# Patient Record
Sex: Female | Born: 1940 | Race: White | Hispanic: No | Marital: Single | Smoking: Never smoker
Health system: Southern US, Community
[De-identification: ages and names within clinical notes are randomized; demographics above are authoritative.]

## PROBLEM LIST (undated history)

## (undated) DIAGNOSIS — N35919 Unspecified urethral stricture, male, unspecified site: Secondary | ICD-10-CM

## (undated) DIAGNOSIS — N390 Urinary tract infection, site not specified: Secondary | ICD-10-CM

## (undated) DIAGNOSIS — I10 Essential (primary) hypertension: Secondary | ICD-10-CM

## (undated) DIAGNOSIS — R339 Retention of urine, unspecified: Secondary | ICD-10-CM

## (undated) DIAGNOSIS — C55 Malignant neoplasm of uterus, part unspecified: Secondary | ICD-10-CM

## (undated) DIAGNOSIS — K219 Gastro-esophageal reflux disease without esophagitis: Secondary | ICD-10-CM

## (undated) DIAGNOSIS — T753XXA Motion sickness, initial encounter: Secondary | ICD-10-CM

## (undated) HISTORY — DX: Urinary tract infection, site not specified: N39.0

## (undated) HISTORY — PX: CARPAL TUNNEL RELEASE: SHX101

## (undated) HISTORY — DX: Retention of urine, unspecified: R33.9

## (undated) HISTORY — DX: Unspecified urethral stricture, male, unspecified site: N35.919

## (undated) HISTORY — PX: ABDOMINAL HYSTERECTOMY: SHX81

## (undated) HISTORY — PX: OTHER SURGICAL HISTORY: SHX169

## (undated) HISTORY — PX: BREAST CYST EXCISION: SHX579

---

## 1999-09-05 DIAGNOSIS — C55 Malignant neoplasm of uterus, part unspecified: Secondary | ICD-10-CM

## 1999-09-05 HISTORY — DX: Malignant neoplasm of uterus, part unspecified: C55

## 2004-12-20 ENCOUNTER — Ambulatory Visit: Payer: Self-pay | Admitting: Gastroenterology

## 2005-02-02 ENCOUNTER — Ambulatory Visit: Payer: Self-pay | Admitting: Gastroenterology

## 2005-10-02 ENCOUNTER — Ambulatory Visit: Payer: Self-pay | Admitting: Family Medicine

## 2006-10-04 ENCOUNTER — Ambulatory Visit: Payer: Self-pay | Admitting: Internal Medicine

## 2007-10-07 ENCOUNTER — Ambulatory Visit: Payer: Self-pay | Admitting: Internal Medicine

## 2007-12-09 ENCOUNTER — Other Ambulatory Visit: Payer: Self-pay

## 2007-12-09 ENCOUNTER — Emergency Department: Payer: Self-pay | Admitting: Emergency Medicine

## 2008-12-14 ENCOUNTER — Ambulatory Visit: Payer: Self-pay | Admitting: Internal Medicine

## 2008-12-22 ENCOUNTER — Ambulatory Visit: Payer: Self-pay | Admitting: Internal Medicine

## 2009-11-16 ENCOUNTER — Ambulatory Visit: Payer: Self-pay | Admitting: Internal Medicine

## 2010-01-24 ENCOUNTER — Ambulatory Visit: Payer: Self-pay | Admitting: Internal Medicine

## 2011-07-11 ENCOUNTER — Ambulatory Visit: Payer: Self-pay | Admitting: Internal Medicine

## 2012-08-30 ENCOUNTER — Ambulatory Visit: Payer: Self-pay | Admitting: Internal Medicine

## 2013-10-24 ENCOUNTER — Ambulatory Visit: Payer: Self-pay | Admitting: Internal Medicine

## 2015-05-24 ENCOUNTER — Other Ambulatory Visit: Payer: Self-pay | Admitting: Internal Medicine

## 2015-05-24 DIAGNOSIS — Z1231 Encounter for screening mammogram for malignant neoplasm of breast: Secondary | ICD-10-CM

## 2015-05-31 ENCOUNTER — Other Ambulatory Visit: Payer: Self-pay | Admitting: Internal Medicine

## 2015-05-31 ENCOUNTER — Ambulatory Visit
Admission: RE | Admit: 2015-05-31 | Discharge: 2015-05-31 | Disposition: A | Discharge status: Home / Self Care | Payer: Medicare Other | Source: Ambulatory Visit | Attending: Internal Medicine | Admitting: Internal Medicine

## 2015-05-31 DIAGNOSIS — Z1231 Encounter for screening mammogram for malignant neoplasm of breast: Secondary | ICD-10-CM

## 2015-05-31 HISTORY — DX: Malignant neoplasm of uterus, part unspecified: C55

## 2015-08-16 ENCOUNTER — Ambulatory Visit: Payer: Self-pay | Admitting: Urology

## 2016-04-27 ENCOUNTER — Telehealth: Payer: Self-pay

## 2016-04-27 ENCOUNTER — Ambulatory Visit (INDEPENDENT_AMBULATORY_CARE_PROVIDER_SITE_OTHER): Payer: Medicare Other | Admitting: Urology

## 2016-04-27 ENCOUNTER — Encounter: Payer: Self-pay | Admitting: Urology

## 2016-04-27 VITALS — BP 138/79 | HR 80 | Ht 60.0 in | Wt 152.8 lb

## 2016-04-27 DIAGNOSIS — R3915 Urgency of urination: Secondary | ICD-10-CM | POA: Diagnosis not present

## 2016-04-27 DIAGNOSIS — R339 Retention of urine, unspecified: Secondary | ICD-10-CM | POA: Diagnosis not present

## 2016-04-27 DIAGNOSIS — R300 Dysuria: Secondary | ICD-10-CM | POA: Diagnosis not present

## 2016-04-27 LAB — URINALYSIS, COMPLETE
Bilirubin, UA: NEGATIVE
GLUCOSE, UA: NEGATIVE
Ketones, UA: NEGATIVE
Leukocytes, UA: NEGATIVE
Nitrite, UA: NEGATIVE
PH UA: 7 (ref 5.0–7.5)
PROTEIN UA: NEGATIVE
RBC, UA: NEGATIVE
Specific Gravity, UA: 1.015 (ref 1.005–1.030)
Urobilinogen, Ur: 0.2 mg/dL (ref 0.2–1.0)

## 2016-04-27 LAB — MICROSCOPIC EXAMINATION
BACTERIA UA: NONE SEEN
EPITHELIAL CELLS (NON RENAL): NONE SEEN /HPF (ref 0–10)

## 2016-04-27 LAB — BLADDER SCAN AMB NON-IMAGING: Scan Result: 188

## 2016-04-27 MED ORDER — CIPROFLOXACIN HCL 250 MG PO TABS: 250.0000 mg | ORAL_TABLET | Freq: Two times a day (BID) | ORAL | 0 refills | Status: DC

## 2016-04-27 NOTE — Telephone Encounter (Signed)
Pt called stating she is having urethra spasms and thinks she has a UTI. Nurse made pt aware she has not been seen in the last year therefore she needs an office appt. Pt voiced understanding. Pt was transferred to the front to make f/u appt.

## 2016-04-27 NOTE — Progress Notes (Signed)
04/27/2016 10:04 AM   Renee Peck April 15, 1941 [id number]  Referring provider: No referring provider defined for this encounter.  Chief Complaint  Patient presents with  . Urinary Tract Infection    patient thinks she has an uti    HPI: Patient is a 75 year old Caucasian female who presents today requesting an urgent appointment for urethral spasms.  Patient states that last evening she started having spasms in her urethra.  She had an old prescription of Cipro on hand, so she took one tablet.  Her symptoms did not improve.  She is having frequency, urgency, intermittency and straining to urinate.  She denies gross hematuria or dysuria.  She is not having fevers, chills or vomiting.  She is having nausea.    She is having associated lower back pain and lower abdominal pain.  Her UA today is unremarkable.  Her PVR was 188 mL.    PMH: Past Medical History:  Diagnosis Date  . Recurrent UTI   . Urethral stricture   . Uterine cancer [facility]) 2001    Surgical History: Past Surgical History:  Procedure Laterality Date  . ABDOMINAL HYSTERECTOMY    . bladder polyps    . CARPAL TUNNEL RELEASE      Home Medications:    Medication List       Accurate as of 04/27/16 10:04 AM. Always use your most recent med list.          aspirin EC 81 MG tablet Take by mouth.   calcium citrate-vitamin D 315-200 MG-UNIT tablet Commonly known as:  CITRACAL+D Take by mouth.   ciprofloxacin 250 MG tablet Commonly known as:  CIPRO Take 1 tablet (250 mg total) by mouth 2 (two) times daily.   FISH OIL PO Take by mouth.   MULTI-VITAMINS Tabs Take by mouth.   NEOMYCIN-POLYMYXIN-HYDROCORTISONE 1 % Soln otic solution Commonly known as:  CORTISPORIN Place in ear(s).   omeprazole 20 MG capsule Commonly known as:  PRILOSEC Take by mouth.   triamterene-hydrochlorothiazide 37.5-25 MG capsule Commonly known as:  DYAZIDE Take by mouth.   verapamil 120 MG CR tablet Commonly known  as:  CALAN-SR Take by mouth.   vitamin E 400 UNIT capsule Take by mouth.       Allergies:  Allergies  Allergen Reactions  . Losartan Other (See Comments)    Hypotension.  . Metronidazole Other (See Comments)    Onset 05/24/1999  . Penicillins Other (See Comments)    05/13/2002    Family History: Family History  Problem Relation Age of Onset  . Breast cancer Mother 29  . Breast cancer Sister 68    paternal sister   . Breast cancer Maternal Aunt 60  . Kidney disease Neg Hx   . Bladder Cancer Neg Hx     Social History:  reports that she has never smoked. She has never used smokeless tobacco. She reports that she does not drink alcohol or use drugs.  ROS: UROLOGY Frequent Urination?: Yes Hard to postpone urination?: Yes Burning/pain with urination?: No Get up at night to urinate?: Yes Leakage of urine?: No Urine stream starts and stops?: Yes Trouble starting stream?: No Do you have to strain to urinate?: Yes Blood in urine?: No Urinary tract infection?: Yes Sexually transmitted disease?: No Injury to kidneys or bladder?: No Painful intercourse?: No Weak stream?: No Currently pregnant?: No Vaginal bleeding?: No Last menstrual period?: n  Gastrointestinal Nausea?: Yes Vomiting?: No Indigestion/heartburn?: No Diarrhea?: No Constipation?: No  Constitutional  Fever: No Night sweats?: No Weight loss?: No Fatigue?: No  Skin Skin rash/lesions?: No Itching?: No  Eyes Blurred vision?: No Double vision?: No  Ears/Nose/Throat Sore throat?: No Sinus problems?: No  Hematologic/Lymphatic Swollen glands?: No Easy bruising?: No  Cardiovascular Leg swelling?: No Chest pain?: No  Respiratory Cough?: No Shortness of breath?: No  Endocrine Excessive thirst?: No  Musculoskeletal Back pain?: No Joint pain?: No  Neurological Headaches?: No Dizziness?: No  Psychologic Depression?: No Anxiety?: No  Physical Exam: BP 138/79   Pulse 80   Ht 5'  (1.524 m)   Wt 152 lb 12.8 oz (69.3 kg)   BMI 29.84 kg/m   Constitutional: Well nourished. Alert and oriented, No acute distress. HEENT: [place] AT, moist mucus membranes. Trachea midline, no masses. Cardiovascular: No clubbing, cyanosis, or edema. Respiratory: Normal respiratory effort, no increased work of breathing. GI: Abdomen is soft, non tender, non distended, no abdominal masses. Liver and spleen not palpable.  No hernias appreciated.  Stool sample for occult testing is not indicated.   GU: No CVA tenderness.  No bladder fullness or masses.   Skin: No rashes, bruises or suspicious lesions. Lymph: No cervical or inguinal adenopathy. Neurologic: Grossly intact, no focal deficits, moving all 4 extremities. Psychiatric: Normal mood and affect.  Laboratory Data: Urinalysis UA is unremarkable.  Pertinent Imaging: Results for Renee, Peck (MRN [id number]) as of 04/27/2016 12:32  Ref. Range 04/27/2016 09:55  Scan Result Unknown 188    Assessment & Plan:    1. Urgency  - UA is unremarkable- could be due to the initiation of antibiotics  - Urinalysis, Complete  - patient is on fluid pills  - continue the Cipro 250 mg, bid for 7 days  2. Incomplete emptying  - PVR is below 200 cc  - RTC in 2 weeks for urethral dilation   Return for schedule urethral dilation in the office.  These notes generated with voice recognition software. I apologize for typographical errors.  Zara Council, PA-C  [facility] [address] [place], [place] [address] [phone] /*-

## 2016-04-27 NOTE — Addendum Note (Signed)
Addended by: Lestine Box on: 04/27/2016 03:15 PM   Modules accepted: Orders

## 2016-05-18 ENCOUNTER — Encounter: Payer: Self-pay | Admitting: Urology

## 2016-05-18 ENCOUNTER — Ambulatory Visit (INDEPENDENT_AMBULATORY_CARE_PROVIDER_SITE_OTHER): Payer: Medicare Other | Admitting: Urology

## 2016-05-18 VITALS — BP 137/76 | HR 71 | Ht 60.0 in | Wt 151.7 lb

## 2016-05-18 DIAGNOSIS — B373 Candidiasis of vulva and vagina: Secondary | ICD-10-CM

## 2016-05-18 DIAGNOSIS — R339 Retention of urine, unspecified: Secondary | ICD-10-CM | POA: Diagnosis not present

## 2016-05-18 DIAGNOSIS — R3915 Urgency of urination: Secondary | ICD-10-CM | POA: Diagnosis not present

## 2016-05-18 DIAGNOSIS — B3731 Acute candidiasis of vulva and vagina: Secondary | ICD-10-CM

## 2016-05-18 LAB — URINALYSIS, COMPLETE
BILIRUBIN UA: NEGATIVE
Glucose, UA: NEGATIVE
KETONES UA: NEGATIVE
LEUKOCYTES UA: NEGATIVE
Nitrite, UA: NEGATIVE
PROTEIN UA: NEGATIVE
RBC UA: NEGATIVE
SPEC GRAV UA: 1.015 (ref 1.005–1.030)
UUROB: 0.2 mg/dL (ref 0.2–1.0)
pH, UA: 8 — ABNORMAL HIGH (ref 5.0–7.5)

## 2016-05-18 LAB — MICROSCOPIC EXAMINATION

## 2016-05-18 MED ORDER — FLUCONAZOLE 150 MG PO TABS: 150.0000 mg | ORAL_TABLET | Freq: Once | ORAL | 0 refills | Status: AC

## 2016-05-18 MED ORDER — CIPROFLOXACIN HCL 250 MG PO TABS: 250.0000 mg | ORAL_TABLET | Freq: Two times a day (BID) | ORAL | 0 refills | Status: DC

## 2016-05-18 NOTE — Progress Notes (Signed)
By right yet right now we will

## 2016-05-18 NOTE — Progress Notes (Signed)
05/18/2016 9:11 AM   Renee Peck Mar 05, 1941 [id number]  Referring provider: No referring provider defined for this encounter.  Chief Complaint  Patient presents with  . Follow-up    incomplete bladder emptying/ urethral dilation    HPI: Patient is a 75 year old Caucasian female who presents today for urethral dilation.    Two weeks ago, she started having spasms in her urethra.  She had an old prescription of Cipro on hand, so she took one tablet.  Her symptoms did not improve.  She was having frequency, urgency, intermittency and straining to urinate.  She denies gross hematuria or dysuria.  She was not having fevers, chills or vomiting.  She was having nausea.  She was having associated lower back pain and lower abdominal pain.  Her UA today at that visit was unremarkable.   Her PVR was 188 mL.    Since, she had already initiated antibiotic therapy, she was instructed to complete the Cipro and return in two weeks for dilation.    Today, she is experiencing frequency of urination and nocturia. She also experienced constipation. He is still having spasms intermittency and she took over-the-counter Monistat for those spasms.  UA today is unremarkable.  He is not having vaginal discharge, fever, chills, nausea or vomiting.   PMH: Past Medical History:  Diagnosis Date  . Incomplete bladder emptying   . Recurrent UTI   . Urethral stricture   . Uterine cancer [facility]) 2001    Surgical History: Past Surgical History:  Procedure Laterality Date  . ABDOMINAL HYSTERECTOMY    . bladder polyps    . CARPAL TUNNEL RELEASE      Home Medications:    Medication List       Accurate as of 05/18/16  9:11 AM. Always use your most recent med list.          aspirin EC 81 MG tablet Take by mouth.   calcium citrate-vitamin D 315-200 MG-UNIT tablet Commonly known as:  CITRACAL+D Take by mouth.   ciprofloxacin 250 MG tablet Commonly known as:  CIPRO Take 1 tablet (250 mg  total) by mouth 2 (two) times daily.   FISH OIL PO Take by mouth.   fluconazole 150 MG tablet Commonly known as:  DIFLUCAN Take 1 tablet (150 mg total) by mouth once.   MULTI-VITAMINS Tabs Take by mouth.   NEOMYCIN-POLYMYXIN-HYDROCORTISONE 1 % Soln otic solution Commonly known as:  CORTISPORIN Place in ear(s).   omeprazole 20 MG capsule Commonly known as:  PRILOSEC Take by mouth.   triamterene-hydrochlorothiazide 37.5-25 MG capsule Commonly known as:  DYAZIDE Take by mouth.   verapamil 120 MG CR tablet Commonly known as:  CALAN-SR Take by mouth.   vitamin E 400 UNIT capsule Take by mouth.       Allergies:  Allergies  Allergen Reactions  . Losartan Other (See Comments)    Hypotension.  . Metronidazole Other (See Comments)    Onset 05/24/1999  . Penicillins Other (See Comments)    05/13/2002    Family History: Family History  Problem Relation Age of Onset  . Breast cancer Mother 90  . Breast cancer Sister 20    paternal sister   . Breast cancer Maternal Aunt 60  . Kidney disease Neg Hx   . Bladder Cancer Neg Hx     Social History:  reports that she has never smoked. She has never used smokeless tobacco. She reports that she does not drink alcohol or use drugs.  ROS: UROLOGY Frequent Urination?: Yes Hard to postpone urination?: No Burning/pain with urination?: No Get up at night to urinate?: Yes Leakage of urine?: No Urine stream starts and stops?: No Trouble starting stream?: No Do you have to strain to urinate?: No Blood in urine?: No Urinary tract infection?: No Sexually transmitted disease?: No Injury to kidneys or bladder?: No Painful intercourse?: No Weak stream?: No Currently pregnant?: No Vaginal bleeding?: No Last menstrual period?: n  Gastrointestinal Nausea?: No Vomiting?: No Indigestion/heartburn?: No Diarrhea?: No Constipation?: Yes  Constitutional Fever: No Night sweats?: No Weight loss?: No Fatigue?: No  Skin Skin  rash/lesions?: No Itching?: No  Eyes Blurred vision?: No Double vision?: No  Ears/Nose/Throat Sore throat?: No Sinus problems?: No  Hematologic/Lymphatic Swollen glands?: No Easy bruising?: No  Cardiovascular Leg swelling?: No Chest pain?: No  Respiratory Cough?: No Shortness of breath?: No  Endocrine Excessive thirst?: No  Musculoskeletal Back pain?: No Joint pain?: No  Neurological Headaches?: No Dizziness?: No  Psychologic Depression?: No Anxiety?: No  Physical Exam: BP 137/76   Pulse 71   Ht 5' (1.524 m)   Wt 151 lb 11.2 oz (68.8 kg)   BMI 29.63 kg/m   Constitutional: Well nourished. Alert and oriented, No acute distress. HEENT: [place] AT, moist mucus membranes. Trachea midline, no masses. Cardiovascular: No clubbing, cyanosis, or edema. Respiratory: Normal respiratory effort, no increased work of breathing. GI: Abdomen is soft, non tender, non distended, no abdominal masses. Liver and spleen not palpable.  No hernias appreciated.  Stool sample for occult testing is not indicated.   GU: No CVA tenderness.  No bladder fullness or masses.   Skin: No rashes, bruises or suspicious lesions. Lymph: No cervical or inguinal adenopathy. Neurologic: Grossly intact, no focal deficits, moving all 4 extremities. Psychiatric: Normal mood and affect.  Laboratory Data: Urinalysis UA is unremarkable.   Procedure Patient is placed in stirrups and her urethral meatus and vulva are cleansed with Betadine.  2% Lidocaine jelly was inserted into her urethra.  I then dilated her with Elby Showers sounds to a 30 without difficultly.  She tolerated the procedure well.  She will return in 6 months.  Assessment & Plan:    1. Urgency  - UA is unremarkable  - Urinalysis, Complete  - patient is on fluid pills  - Cipro completed  - script given for Cipro for symptoms of infection that may occur after dilation  2. Incomplete emptying  - PVR is below 200 cc  - dilation performed  today  - RTC in 8 months for urethral dilation  3. Vaginal yeast infection  - script given for diflucan   Return in about 8 months (around 01/15/2017) for urethral dilation.  These notes generated with voice recognition software. I apologize for typographical errors.  Zara Council, PA-C  [facility] [address], [place], [place] [address] [phone] /*-

## 2016-11-23 ENCOUNTER — Other Ambulatory Visit: Payer: Self-pay | Admitting: Internal Medicine

## 2016-11-23 DIAGNOSIS — Z1231 Encounter for screening mammogram for malignant neoplasm of breast: Secondary | ICD-10-CM

## 2016-12-22 ENCOUNTER — Ambulatory Visit: Payer: PRIVATE HEALTH INSURANCE

## 2017-01-15 ENCOUNTER — Ambulatory Visit: Payer: Medicare Other | Admitting: Urology

## 2017-02-27 ENCOUNTER — Ambulatory Visit
Admission: RE | Admit: 2017-02-27 | Discharge: 2017-02-27 | Disposition: A | Discharge status: Home / Self Care | Payer: Medicare Other | Source: Ambulatory Visit | Attending: Internal Medicine | Admitting: Internal Medicine

## 2017-02-27 DIAGNOSIS — Z1231 Encounter for screening mammogram for malignant neoplasm of breast: Secondary | ICD-10-CM | POA: Insufficient documentation

## 2017-03-07 NOTE — Progress Notes (Signed)
03/08/2017 10:24 AM   Clois Comber 1941-08-06 [id number]  Referring provider: Tracie Harrier, [facility] [place], [place] [address]  Chief Complaint  Patient presents with  . Urinary Frequency    dialation    HPI: Patient is a 76 year old Caucasian female who presents today with the complaint of poor stream with voiding.  The patient is having issues with her bladder and urethra.  The patient has been experiencing mild urgency, frequency x q hour, not restricting fluids to avoid visits to the restroom , is engaging in toilet mapping, incontinence with SUI and nocturia x 1-2.  She denies any dysuria, suprapubic pain or gross hematuria. She has not had recent fevers, chills, nausea or vomiting.  Her UA is unremarkable.   Her PVR 0 mL.      She states that her urine was cloudy at her PCP office visit and she was placed on an antibiotic.     PMH: Past Medical History:  Diagnosis Date  . Incomplete bladder emptying   . Recurrent UTI   . Urethral stricture   . Uterine cancer [facility]) 2001    Surgical History: Past Surgical History:  Procedure Laterality Date  . ABDOMINAL HYSTERECTOMY    . bladder polyps    . CARPAL TUNNEL RELEASE      Home Medications:  Allergies as of 03/08/2017      Reactions   Losartan Other (See Comments)   Hypotension.   Metronidazole Other (See Comments)   Onset 05/24/1999   Penicillins Other (See Comments)   05/13/2002      Medication List       Accurate as of 03/08/17 10:24 AM. Always use your most recent med list.          aspirin EC 81 MG tablet Take by mouth.   calcium citrate-vitamin D 315-200 MG-UNIT tablet Commonly known as:  CITRACAL+D Take by mouth.   FISH OIL PO Take by mouth.   MULTI-VITAMINS Tabs Take by mouth.   NEOMYCIN-POLYMYXIN-HYDROCORTISONE 1 % Soln OTIC solution Commonly known as:  CORTISPORIN Place in ear(s).   omeprazole 20 MG capsule Commonly known as:   PRILOSEC Take by mouth.   triamterene-hydrochlorothiazide 37.5-25 MG capsule Commonly known as:  DYAZIDE Take by mouth.   verapamil 120 MG CR tablet Commonly known as:  CALAN-SR Take by mouth.       Allergies:  Allergies  Allergen Reactions  . Losartan Other (See Comments)    Hypotension.  . Metronidazole Other (See Comments)    Onset 05/24/1999  . Penicillins Other (See Comments)    05/13/2002    Family History: Family History  Problem Relation Age of Onset  . Breast cancer Mother 58  . Breast cancer Sister 88       paternal sister   . Breast cancer Maternal Aunt 60  . Breast cancer Cousin   . Kidney disease Neg Hx   . Bladder Cancer Neg Hx     Social History:  reports that she has never smoked. She has never used smokeless tobacco. She reports that she does not drink alcohol or use drugs.  ROS: UROLOGY Frequent Urination?: No Hard to postpone urination?: No Burning/pain with urination?: No Get up at night to urinate?: Yes Leakage of urine?: Yes Urine stream starts and stops?: Yes Trouble starting stream?: No Do you have to strain to urinate?: No Blood in urine?: No Urinary tract infection?: No Sexually transmitted disease?: No Injury to kidneys  or bladder?: No Painful intercourse?: No Weak stream?: No Currently pregnant?: No Vaginal bleeding?: No Last menstrual period?: n  Gastrointestinal Nausea?: No Vomiting?: No Indigestion/heartburn?: No Diarrhea?: No Constipation?: No  Constitutional Fever: No Night sweats?: No Weight loss?: No Fatigue?: No  Skin Skin rash/lesions?: No Itching?: No  Eyes Blurred vision?: No Double vision?: No  Ears/Nose/Throat Sore throat?: No Sinus problems?: Yes  Hematologic/Lymphatic Swollen glands?: No Easy bruising?: No  Cardiovascular Leg swelling?: No Chest pain?: No  Respiratory Cough?: No Shortness of breath?: No  Endocrine Excessive thirst?: No  Musculoskeletal Back pain?: No Joint  pain?: No  Neurological Headaches?: No Dizziness?: No  Psychologic Depression?: No Anxiety?: No  Physical Exam: BP 123/79   Pulse 60   Ht 5' (1.524 m)   Wt 147 lb (66.7 kg)   BMI 28.71 kg/m   Constitutional: Well nourished. Alert and oriented, No acute distress. HEENT: [place] AT, moist mucus membranes. Trachea midline, no masses. Cardiovascular: No clubbing, cyanosis, or edema. Respiratory: Normal respiratory effort, no increased work of breathing. GI: Abdomen is soft, non tender, non distended, no abdominal masses. Liver and spleen not palpable.  No hernias appreciated.  Stool sample for occult testing is not indicated.   GU: No CVA tenderness.  No bladder fullness or masses.  Atorphic external genitalia, normal pubic hair distribution, no lesions.  Normal urethral meatus, no lesions, no prolapse, no discharge.   No urethral masses, tenderness and/or tenderness. No bladder fullness, tenderness or masses. Pale vagina mucosa, poor estrogen effect, no discharge, no lesions, good pelvic support, Grade I cystocele noted.  No rectocele noted.  Cervix, uterus and adnexa are surgically absent.  Anus and perineum are without rashes or lesions.    Skin: No rashes, bruises or suspicious lesions. Lymph: No cervical or inguinal adenopathy. Neurologic: Grossly intact, no focal deficits, moving all 4 extremities. Psychiatric: Normal mood and affect.  Laboratory Data: Urinalysis UA is unremarkable.  See EPIC.    I have reviewed the labs.  Pertinent imaging Results for ANAJAH, STERBENZ (MRN [id number]) as of 03/08/2017 09:58  Ref. Range 03/08/2017 09:43  Scan Result Unknown 46ml      Procedure Patient is placed in stirrups and her urethral meatus and vulva are cleansed with Betadine.  2% Lidocaine jelly was inserted into her urethra.  I then dilated her with Elby Showers sounds to a 28 without difficultly.  She tolerated the procedure well.  She will return in 10 months.  Assessment & Plan:    1.  Urinary intermittency   - Urinalysis, Complete  - dilation completed today   2. Vaginal atrophy  - Patient has vaginal estrogen cream at home, but she is reluctant to use it as she has a history of cancer and strong family history of breast cancer  - I did explain that a good help prevent urinary tract infections and also may reduce her need for urethral dilation  - She stated that she would consider this and if she did start to use the vaginal estrogen cream advised her to use a 3 nights weekly  3. Urethral spasms  - Patient feels that dilation is only thing that helps her symptoms  - she is not wanting to try other treatment modalities at this time  - RTC in 10 months for OAB questionnaire, PVR and exam    Return in about 10 months (around 01/06/2018) for OAB questionnaire, PVR and exam.  These notes generated with voice recognition software. I apologize for typographical errors.  Zara Council, PA-C  [facility] [address] [place], [place] [address] [phone] /*-

## 2017-03-08 ENCOUNTER — Ambulatory Visit (INDEPENDENT_AMBULATORY_CARE_PROVIDER_SITE_OTHER): Payer: Medicare Other | Admitting: Urology

## 2017-03-08 ENCOUNTER — Encounter: Payer: Self-pay | Admitting: Urology

## 2017-03-08 VITALS — BP 123/79 | HR 60 | Ht 60.0 in | Wt 147.0 lb

## 2017-03-08 DIAGNOSIS — M1990 Unspecified osteoarthritis, unspecified site: Secondary | ICD-10-CM | POA: Insufficient documentation

## 2017-03-08 DIAGNOSIS — N369 Urethral disorder, unspecified: Secondary | ICD-10-CM

## 2017-03-08 DIAGNOSIS — R39198 Other difficulties with micturition: Secondary | ICD-10-CM | POA: Diagnosis not present

## 2017-03-08 DIAGNOSIS — N952 Postmenopausal atrophic vaginitis: Secondary | ICD-10-CM | POA: Diagnosis not present

## 2017-03-08 DIAGNOSIS — R339 Retention of urine, unspecified: Secondary | ICD-10-CM | POA: Diagnosis not present

## 2017-03-08 DIAGNOSIS — I10 Essential (primary) hypertension: Secondary | ICD-10-CM | POA: Insufficient documentation

## 2017-03-08 DIAGNOSIS — E785 Hyperlipidemia, unspecified: Secondary | ICD-10-CM | POA: Insufficient documentation

## 2017-03-08 DIAGNOSIS — K589 Irritable bowel syndrome without diarrhea: Secondary | ICD-10-CM | POA: Insufficient documentation

## 2017-03-08 DIAGNOSIS — C541 Malignant neoplasm of endometrium: Secondary | ICD-10-CM | POA: Insufficient documentation

## 2017-03-08 LAB — MICROSCOPIC EXAMINATION: Bacteria, UA: NONE SEEN

## 2017-03-08 LAB — URINALYSIS, COMPLETE
BILIRUBIN UA: NEGATIVE
Glucose, UA: NEGATIVE
KETONES UA: NEGATIVE
Leukocytes, UA: NEGATIVE
NITRITE UA: NEGATIVE
Protein, UA: NEGATIVE
RBC UA: NEGATIVE
SPEC GRAV UA: 1.01 (ref 1.005–1.030)
Urobilinogen, Ur: 0.2 mg/dL (ref 0.2–1.0)
pH, UA: 7 (ref 5.0–7.5)

## 2017-03-08 LAB — BLADDER SCAN AMB NON-IMAGING

## 2018-01-07 ENCOUNTER — Ambulatory Visit: Payer: Medicare Other | Admitting: Urology

## 2018-04-08 ENCOUNTER — Other Ambulatory Visit: Payer: Self-pay | Admitting: Internal Medicine

## 2018-04-08 DIAGNOSIS — Z1231 Encounter for screening mammogram for malignant neoplasm of breast: Secondary | ICD-10-CM

## 2018-04-25 ENCOUNTER — Ambulatory Visit
Admission: RE | Admit: 2018-04-25 | Discharge: 2018-04-25 | Disposition: A | Discharge status: Home / Self Care | Payer: Medicare Other | Source: Ambulatory Visit | Attending: Internal Medicine | Admitting: Internal Medicine

## 2018-04-25 DIAGNOSIS — Z1231 Encounter for screening mammogram for malignant neoplasm of breast: Secondary | ICD-10-CM | POA: Insufficient documentation

## 2019-05-15 ENCOUNTER — Other Ambulatory Visit: Payer: Self-pay | Admitting: Internal Medicine

## 2019-05-15 DIAGNOSIS — Z1231 Encounter for screening mammogram for malignant neoplasm of breast: Secondary | ICD-10-CM

## 2019-10-30 ENCOUNTER — Ambulatory Visit: Payer: Medicare Other | Attending: Internal Medicine

## 2019-10-30 DIAGNOSIS — Z23 Encounter for immunization: Secondary | ICD-10-CM

## 2019-10-30 NOTE — Progress Notes (Signed)
   Covid-19 Vaccination Clinic  Name:  Renee Peck    MRN: [id number] DOB: 31-May-1941  10/30/2019  [name] was observed post Covid-19 immunization for 15 minutes without incidence. She was provided with Vaccine Information Sheet and instruction to access the V-Safe system.   Ms. Chew was instructed to call 911 with any severe reactions post vaccine: Marland Kitchen Difficulty breathing  . Swelling of your face and throat  . A fast heartbeat  . A bad rash all over your body  . Dizziness and weakness    Immunizations Administered    Name Date Dose VIS Date Route   Pfizer COVID-19 Vaccine 10/30/2019 10:50 AM 0.3 mL 08/15/2019 Intramuscular   Manufacturer: [place]   Lot: [id number]   [place]: [id number]

## 2019-11-26 ENCOUNTER — Ambulatory Visit: Payer: Medicare Other | Attending: Internal Medicine

## 2019-11-26 DIAGNOSIS — Z23 Encounter for immunization: Secondary | ICD-10-CM

## 2019-11-26 NOTE — Progress Notes (Signed)
   Covid-19 Vaccination Clinic  Name:  Renee Peck    MRN: [id number] DOB: [birth year]  11/26/2019  [name] was observed post Covid-19 immunization for 15 minutes without incident. She was provided with Vaccine Information Sheet and instruction to access the V-Safe system.   [name] was instructed to call 911 with any severe reactions post vaccine: Marland Kitchen Difficulty breathing  . Swelling of face and throat  . A fast heartbeat  . A bad rash all over body  . Dizziness and weakness   Immunizations Administered    Name Date Dose VIS Date Route   Pfizer COVID-19 Vaccine 11/26/2019  8:22 AM 0.3 mL 08/15/2019 Intramuscular   Manufacturer: [employer], [employer]   Lot: [id number]   [place]: [id number]

## 2020-01-13 ENCOUNTER — Ambulatory Visit
Admission: RE | Admit: 2020-01-13 | Discharge: 2020-01-13 | Disposition: A | Discharge status: Home / Self Care | Payer: Medicare Other | Source: Ambulatory Visit | Attending: Internal Medicine | Admitting: Internal Medicine

## 2020-01-13 DIAGNOSIS — Z1231 Encounter for screening mammogram for malignant neoplasm of breast: Secondary | ICD-10-CM | POA: Diagnosis present

## 2020-08-06 ENCOUNTER — Ambulatory Visit: Payer: Medicare Other | Attending: Internal Medicine

## 2020-08-06 ENCOUNTER — Other Ambulatory Visit: Payer: Self-pay | Admitting: Internal Medicine

## 2020-08-06 DIAGNOSIS — Z23 Encounter for immunization: Secondary | ICD-10-CM

## 2020-08-06 NOTE — Progress Notes (Signed)
   Covid-19 Vaccination Clinic  Name:  Renee Peck    MRN: [id number] DOB: 17-Jun-1941  08/06/2020  [name] was observed post Covid-19 immunization for 15 minutes without incident. She was provided with Vaccine Information Sheet and instruction to access the V-Safe system.   [name] was instructed to call 911 with any severe reactions post vaccine: Marland Kitchen Difficulty breathing  . Swelling of face and throat  . A fast heartbeat  . A bad rash all over body  . Dizziness and weakness   Immunizations Administered    Name Date Dose VIS Date Route   Pfizer COVID-19 Vaccine 08/06/2020 11:35 AM 0.3 mL 06/23/2020 Intramuscular   Manufacturer: [place]   Lot: [id number]   [place]: [id number]

## 2021-02-09 ENCOUNTER — Other Ambulatory Visit: Payer: Self-pay | Admitting: Internal Medicine

## 2021-04-26 ENCOUNTER — Other Ambulatory Visit: Payer: Self-pay

## 2021-04-26 ENCOUNTER — Ambulatory Visit (LOCAL_COMMUNITY_HEALTH_CENTER): Payer: Medicare Other

## 2021-04-26 DIAGNOSIS — Z23 Encounter for immunization: Secondary | ICD-10-CM

## 2021-04-26 NOTE — Progress Notes (Signed)
In Nurse Clinic for Tdap. Per [facility], last Tdap 05/02/2011. Consult Vertell Novak, MD since pt's last Tdap is 5 days before 10 years. Provider gives ok for Tdap today. Tdap administered and tolerated well. Updated [facility] copy given. Josie Saunders, RN

## 2021-05-26 ENCOUNTER — Other Ambulatory Visit: Payer: Self-pay | Admitting: Internal Medicine

## 2021-05-26 DIAGNOSIS — N6320 Unspecified lump in the left breast, unspecified quadrant: Secondary | ICD-10-CM

## 2021-05-26 DIAGNOSIS — C541 Malignant neoplasm of endometrium: Secondary | ICD-10-CM

## 2021-05-26 DIAGNOSIS — Z1231 Encounter for screening mammogram for malignant neoplasm of breast: Secondary | ICD-10-CM

## 2021-05-26 DIAGNOSIS — N6323 Unspecified lump in the left breast, lower outer quadrant: Secondary | ICD-10-CM

## 2021-05-26 DIAGNOSIS — N63 Unspecified lump in unspecified breast: Secondary | ICD-10-CM

## 2021-05-27 ENCOUNTER — Other Ambulatory Visit: Payer: Self-pay | Admitting: Internal Medicine

## 2021-05-27 DIAGNOSIS — N6310 Unspecified lump in the right breast, unspecified quadrant: Secondary | ICD-10-CM

## 2021-05-27 DIAGNOSIS — N6489 Other specified disorders of breast: Secondary | ICD-10-CM

## 2021-05-27 DIAGNOSIS — N63 Unspecified lump in unspecified breast: Secondary | ICD-10-CM

## 2021-05-31 ENCOUNTER — Other Ambulatory Visit: Payer: Self-pay

## 2021-05-31 ENCOUNTER — Ambulatory Visit
Admission: RE | Admit: 2021-05-31 | Discharge: 2021-05-31 | Disposition: A | Discharge status: Home / Self Care | Payer: Medicare Other | Source: Ambulatory Visit | Attending: Internal Medicine | Admitting: Internal Medicine

## 2021-05-31 DIAGNOSIS — N6489 Other specified disorders of breast: Secondary | ICD-10-CM

## 2021-05-31 DIAGNOSIS — N63 Unspecified lump in unspecified breast: Secondary | ICD-10-CM | POA: Diagnosis present

## 2021-05-31 DIAGNOSIS — N6310 Unspecified lump in the right breast, unspecified quadrant: Secondary | ICD-10-CM

## 2022-01-13 ENCOUNTER — Telehealth: Payer: Self-pay | Admitting: Urology

## 2022-01-13 ENCOUNTER — Other Ambulatory Visit: Payer: Self-pay | Admitting: Urology

## 2022-01-13 ENCOUNTER — Emergency Department
Admission: EM | Admit: 2022-01-13 | Discharge: 2022-01-13 | Disposition: A | Discharge status: Home / Self Care | Payer: Medicare Other | Attending: Emergency Medicine | Admitting: Emergency Medicine

## 2022-01-13 ENCOUNTER — Other Ambulatory Visit: Payer: Self-pay

## 2022-01-13 DIAGNOSIS — R339 Retention of urine, unspecified: Secondary | ICD-10-CM | POA: Diagnosis present

## 2022-01-13 DIAGNOSIS — N390 Urinary tract infection, site not specified: Secondary | ICD-10-CM | POA: Diagnosis not present

## 2022-01-13 DIAGNOSIS — N3090 Cystitis, unspecified without hematuria: Secondary | ICD-10-CM

## 2022-01-13 LAB — CBC
HCT: 44.5 % (ref 36.0–46.0)
Hemoglobin: 15.4 g/dL — ABNORMAL HIGH (ref 12.0–15.0)
MCH: 28.4 pg (ref 26.0–34.0)
MCHC: 34.6 g/dL (ref 30.0–36.0)
MCV: 82 fL (ref 80.0–100.0)
Platelets: 309 10*3/uL (ref 150–400)
RBC: 5.43 MIL/uL — ABNORMAL HIGH (ref 3.87–5.11)
RDW: 12.3 % (ref 11.5–15.5)
WBC: 6.7 10*3/uL (ref 4.0–10.5)
nRBC: 0 % (ref 0.0–0.2)

## 2022-01-13 LAB — BASIC METABOLIC PANEL
Anion gap: 15 (ref 5–15)
BUN: 10 mg/dL (ref 8–23)
CO2: 23 mmol/L (ref 22–32)
Calcium: 9.7 mg/dL (ref 8.9–10.3)
Chloride: 91 mmol/L — ABNORMAL LOW (ref 98–111)
Creatinine, Ser: 0.72 mg/dL (ref 0.44–1.00)
GFR, Estimated: >60 mL/min (ref >60)
Glucose, Bld: 129 mg/dL — ABNORMAL HIGH (ref 70–99)
Potassium: 2.8 mmol/L — ABNORMAL LOW (ref 3.5–5.1)
Sodium: 129 mmol/L — ABNORMAL LOW (ref 135–145)

## 2022-01-13 LAB — URINALYSIS, ROUTINE W REFLEX MICROSCOPIC
Bilirubin Urine: NEGATIVE
Glucose, UA: NEGATIVE mg/dL
Hgb urine dipstick: NEGATIVE
Ketones, ur: NEGATIVE mg/dL
Leukocytes,Ua: NEGATIVE
Nitrite: POSITIVE — AB
Protein, ur: NEGATIVE mg/dL
Specific Gravity, Urine: 1.008 (ref 1.005–1.030)
Squamous Epithelial / HPF: NONE SEEN (ref 0–5)
pH: 9 — ABNORMAL HIGH (ref 5.0–8.0)

## 2022-01-13 MED ORDER — CIPROFLOXACIN HCL 500 MG PO TABS: 500.0000 mg | ORAL_TABLET | Freq: Two times a day (BID) | ORAL | 0 refills | Status: AC

## 2022-01-13 MED ORDER — SODIUM CHLORIDE 0.9 % IV SOLN
1.0000 g | Freq: Once | INTRAVENOUS | Status: AC
  Administered 2022-01-13: 1 g via INTRAVENOUS
  Filled 2022-01-13: qty 10

## 2022-01-13 NOTE — ED Provider Notes (Signed)
? ?[facility] ?Provider Note ? ? ? Event Date/Time  ? First MD Initiated Contact with Patient 01/13/22 [id number]   ?  (approximate) ? ? ?History  ? ?Urinary Frequency ? ? ?HPI ? ?Renee Peck is a 81 y.o. female with a history of uterine CA, incomplete bladder emptying, recurrent UTI, urethral strictures who presents with severe difficulty urinating with spasms and pain.  She has been on ciprofloxacin for 4 days ?  ? ? ?Physical Exam  ? ?Triage Vital Signs: ?ED Triage Vitals  ?Enc Vitals Group  ?   BP 01/13/22 0842 (!) 154/62  ?   Pulse Rate 01/13/22 0842 86  ?   Resp 01/13/22 0842 18  ?   Temp 01/13/22 0842 97.7 ?F (36.5 ?C)  ?   Temp Source 01/13/22 0842 Oral  ?   SpO2 01/13/22 0842 97 %  ?   Weight 01/13/22 0840 56.7 kg (125 lb)  ?   Height 01/13/22 0840 1.524 m (5')  ?   Head Circumference --   ?   Peak Flow --   ?   Pain Score 01/13/22 0840 0  ?   Pain Loc --   ?   Pain Edu? --   ?   Excl. in [place]? --   ? ? ?Most recent vital signs: ?Vitals:  ? 01/13/22 0842 01/13/22 0930  ?BP: (!) 154/62 (!) 125/57  ?Pulse: 86 64  ?Resp: 18 18  ?Temp: 97.7 ?F (36.5 ?C)   ?SpO2: 97% 99%  ? ? ? ?General: Awake, no distress.  ?CV:  Good peripheral perfusion.  ?Resp:  Normal effort.  ?Abd:  Mild suprapubic tenderness, no CVA tenderness ?Other:   ? ? ?ED Results / Procedures / Treatments  ? ?Labs ?(all labs ordered are listed, but only abnormal results are displayed) ?Labs Reviewed  ?CBC - Abnormal; Notable for the following components:  ?    Result Value  ? RBC 5.43 (*)   ? Hemoglobin 15.4 (*)   ? All other components within normal limits  ?BASIC METABOLIC PANEL - Abnormal; Notable for the following components:  ? Sodium 129 (*)   ? Potassium 2.8 (*)   ? Chloride 91 (*)   ? Glucose, Bld 129 (*)   ? All other components within normal limits  ?URINALYSIS, ROUTINE W REFLEX MICROSCOPIC - Abnormal; Notable for the following components:  ? Color, Urine AMBER (*)   ? APPearance HAZY (*)   ? pH 9.0 (*)   ? Nitrite  POSITIVE (*)   ? Bacteria, UA FEW (*)   ? All other components within normal limits  ?URINE CULTURE  ? ? ? ?EKG ? ? ? ? ?RADIOLOGY ? ? ? ? ?PROCEDURES: ? ?Critical Care performed:  ? ?Procedures ? ? ?MEDICATIONS ORDERED IN ED: ?Medications  ?cefTRIAXone (ROCEPHIN) 1 g in sodium chloride 0.9 % 100 mL IVPB (1 g Intravenous New Bag/Given 01/13/22 0955)  ? ? ? ?IMPRESSION / MDM / ASSESSMENT AND PLAN / ED COURSE  ?I reviewed the triage vital signs and the nursing notes. ? ?Patient presents with acute urinary retention/difficulty urinating as detailed above. ? ?Reviewed outpatient records, the patient had urinalysis done by her PCP 5 days ago consistent with significant UTI, she has been on appropriate therapy however culture is pending ? ?No improvement with outpatient treatment.  We will place IV, obtain labs, place Foley catheter at her request (I did recommend in and out catheterization) ? ?Patient's lab work is reassuring, CBC  BMP unremarkable, she is afebrile feeling much better after catheterization.  Her urine looks significantly improved than prior urinalysis.  I suspect the Cipro is working.  ? ?Discussed with Dr. Erlene Quan of urology, she will see the patient in her office for voiding trial next week ? ? ? ? ? ?  ? ? ?FINAL CLINICAL IMPRESSION(S) / ED DIAGNOSES  ? ?Final diagnoses:  ?Lower urinary tract infectious disease  ?Cystitis  ?Urinary retention  ? ? ? ?Rx / DC Orders  ? ?ED Discharge Orders   ? ?      Ordered  ?  ciprofloxacin (CIPRO) 500 MG tablet  2 times daily       ? 01/13/22 1021  ? ?  ?  ? ?  ? ? ? ?Note:  This document was prepared using Dragon voice recognition software and may include unintentional dictation errors. ?  ?Lavonia Drafts, MD ?01/13/22 1025 ? ?

## 2022-01-13 NOTE — Telephone Encounter (Signed)
Appt scheduled and patient is aware ?Renee Peck ?

## 2022-01-13 NOTE — Telephone Encounter (Signed)
Please schedule outpt VT with me, new patient to reestablish care early next week.  Discussed case with Dr. Corky Downs.   ? ?Hollice Espy, MD ? ?

## 2022-01-13 NOTE — ED Triage Notes (Signed)
Pt presents to ED with c/o painful urination and frequency. Reports she feels like she is not able to empty her bladder. Pt states she was seen 4 days ago by her pcp and put on Cipro for a UTI but reports her symptoms have not resolved.   ?

## 2022-01-14 LAB — URINE CULTURE: Culture: NO GROWTH

## 2022-01-16 NOTE — Progress Notes (Addendum)
? ?01/17/2022 ?10:37 AM  ? ?Clois Comber ?[birth year] ?[id number] ? ?Referring provider:  ?Tracie Harrier, MD ?[facility] ?[facility] ?[place],  [place] [address] ?Chief Complaint  ?Patient presents with  ? Follow-up  ? ? ?HPI: ?Renee Peck is a 81 y.o.female who presents today for voiding trial.  ? ?She has a personal history of urinary intermittency, vaginal atrophy, urethral spasms,uterine CA, incomplete bladder emptying, urethral stricture, and rUTIs. She was last seen in clinic by Zara Council, PA-C, in 2018. ? ?She was seen in the ED on 01/13/2022 she presented with severe difficulty urinating with spasms and pain. She was seen previously for a UTI PCP by her and was on ciprofloxacin for 4 days. Her labs were reassuring. She requested a foley catheter be placed and her symptoms improved after catheterization.   Case was discussed with Dr. Dennard Nip at the time who agreed to place the catheter with close urologic follow-up.  From my understanding, she was not in frank retention at the time. ? ?She reports that she has rUTIs and she treated this with pyridium. She is not taking any supplements.  She does have documented infections from September, December, and most recently May all of which are consistent with E. coli. ? ?No recent upper tract imaging. ? ?She is not currently on any UTI prevention strategies. ? ?She does like her symptoms over the last few years have worsened.  She believes strongly that urethral dilations have helped her in the past.  She wonders if her worsening symptoms are contributing to her infections. ? ? ?PMH: ?Past Medical History:  ?Diagnosis Date  ? Incomplete bladder emptying   ? Recurrent UTI   ? Urethral stricture   ? Uterine cancer ([place]) 2001  ? ? ?Surgical History: ?Past Surgical History:  ?Procedure Laterality Date  ? ABDOMINAL HYSTERECTOMY    ? bladder polyps    ? BREAST CYST EXCISION    ? CARPAL TUNNEL RELEASE    ? ? ?Home Medications:  ?Allergies as  of 01/17/2022   ? ?   Reactions  ? Losartan Other (See Comments)  ? Hypotension.  ? Metronidazole Other (See Comments)  ? Onset 05/24/1999  ? Penicillins Other (See Comments)  ? 05/13/2002  ? ?  ? ?  ?Medication List  ?  ? ?  ? Accurate as of Jan 17, 2022 10:37 AM. If you have any questions, ask your nurse or doctor.  ?  ?  ? ?  ? ?STOP taking these medications   ? ?verapamil 120 MG CR tablet ?Commonly known as: CALAN-SR ?Stopped by: Hollice Espy, MD ?  ? ?  ? ?TAKE these medications   ? ?aspirin EC 81 MG tablet ?Take by mouth. ?  ?calcium citrate-vitamin D 315-200 MG-UNIT tablet ?Commonly known as: CITRACAL+D ?Take by mouth. ?  ?ciprofloxacin 500 MG tablet ?Commonly known as: Cipro ?Take 1 tablet (500 mg total) by mouth 2 (two) times daily for 5 days. ?  ?FISH OIL PO ?Take by mouth. ?  ?Multi-Vitamins Tabs ?Take by mouth. ?  ?NEOMYCIN-POLYMYXIN-HYDROCORTISONE 1 % Soln OTIC solution ?Commonly known as: CORTISPORIN ?Place in ear(s). ?  ?omeprazole 20 MG capsule ?Commonly known as: PRILOSEC ?Take by mouth. ?  ?Tiadylt ER 180 MG 24 hr capsule ?Generic drug: diltiazem ?Take 180 mg by mouth daily. ?  ?triamterene-hydrochlorothiazide 37.5-25 MG capsule ?Commonly known as: DYAZIDE ?Take by mouth. ?  ? ?  ? ? ?Allergies:  ?Allergies  ?Allergen Reactions  ? Losartan Other (  See Comments)  ?  Hypotension.  ? Metronidazole Other (See Comments)  ?  Onset 05/24/1999  ? Penicillins Other (See Comments)  ?  05/13/2002  ? ? ?Family History: ?Family History  ?Problem Relation Age of Onset  ? Breast cancer Mother 26  ? Breast cancer Sister 78  ?     paternal sister   ? Breast cancer Maternal Aunt 60  ? Breast cancer Cousin   ? Kidney disease Neg Hx   ? Bladder Cancer Neg Hx   ? ? ?Social History:  reports that she has never smoked. She has never used smokeless tobacco. She reports that she does not drink alcohol and does not use drugs. ? ? ?Physical Exam: ?BP (!) 148/80   Pulse 73   Ht 5' (1.524 m)   Wt 125 lb (56.7 kg)   BMI 24.41  kg/m?   ?Constitutional:  Alert and oriented, No acute distress. ?HEENT: [place] AT, moist mucus membranes.  Trachea midline, no masses. ?Cardiovascular: No clubbing, cyanosis, or edema. ?Respiratory: Normal respiratory effort, no increased work of breathing. ?Skin: No rashes, bruises or suspicious lesions. ?Neurologic: Grossly intact, no focal deficits, moving all 4 extremities. ?Psychiatric: Normal mood and affect. ? ?Laboratory Data: ?Lab Results  ?Component Value Date  ? CREATININE 0.72 01/13/2022  ? ? ? ?Assessment & Plan:   ? ?Urinary retention/ sensation of incomplete bladder emptying ?- Catheter removed today, low threshold to return later today if she has any difficulty urinating ?- She has had urethral dilation in the past recommend she avoid this as there is no clear data to suggest its efficacy and does have risk including infection and pain ?- Will reassess with PVR in 1 month  ? ?2. rUTIs ?-  Counseled her in UTI prevention supplements such as cranberry tablets, probiotics, yogurt that has active lactobacillus culture, and d-mannose  ?-She may benefit from topical estrogen cream if frequency of infections persist although I do see a history of endometrial cancer noted in her chart, will clarify this diagnosis with her when she returns next month ? ?Reassess urinary symptoms in 1 month with UA/PVR ? ?I,Kailey Littlejohn,acting as a Education administrator for Hollice Espy, MD.,have documented all relevant documentation on the behalf of Hollice Espy, MD,as directed by  Hollice Espy, MD while in the presence of Hollice Espy, MD. ? ?I have reviewed the above documentation for accuracy and completeness, and I agree with the above.  ? ?Hollice Espy, MD ? ? ?[facility] ?[address] ?[place], [place] [address] ?[phone] ?I spent 47 total minutes on the day of the encounter including pre-visit review of the medical record, face-to-face time with the patient, and post visit  ordering of labs/imaging/tests.  ER course was reviewed along with her previous records. ? ?

## 2022-01-17 ENCOUNTER — Ambulatory Visit: Payer: Medicare Other | Admitting: Urology

## 2022-01-17 ENCOUNTER — Encounter: Payer: Self-pay | Admitting: Urology

## 2022-01-17 VITALS — BP 148/80 | HR 73 | Ht 60.0 in | Wt 125.0 lb

## 2022-01-17 DIAGNOSIS — R339 Retention of urine, unspecified: Secondary | ICD-10-CM

## 2022-01-17 DIAGNOSIS — R300 Dysuria: Secondary | ICD-10-CM | POA: Diagnosis not present

## 2022-01-17 DIAGNOSIS — Z87440 Personal history of urinary (tract) infections: Secondary | ICD-10-CM

## 2022-01-17 DIAGNOSIS — R3914 Feeling of incomplete bladder emptying: Secondary | ICD-10-CM

## 2022-01-17 DIAGNOSIS — N390 Urinary tract infection, site not specified: Secondary | ICD-10-CM

## 2022-01-17 NOTE — Patient Instructions (Signed)
Cranberry tablets as directed. Probiotics as directed. D-Mannose as directed  ?

## 2022-01-17 NOTE — Progress Notes (Signed)
Catheter Removal ? ?Patient is present today for a catheter removal.  47m of water was drained from the balloon. A 14FR foley cath was removed from the bladder no complications were noted . Patient tolerated well. ? ?Performed by: CElberta Leatherwood CMA ? ? ?

## 2022-01-18 ENCOUNTER — Telehealth: Payer: Self-pay

## 2022-01-18 ENCOUNTER — Ambulatory Visit: Payer: Medicare Other | Admitting: Physician Assistant

## 2022-01-18 ENCOUNTER — Encounter: Payer: Self-pay | Admitting: Physician Assistant

## 2022-01-18 VITALS — Ht 60.0 in | Wt 125.0 lb

## 2022-01-18 DIAGNOSIS — R339 Retention of urine, unspecified: Secondary | ICD-10-CM | POA: Diagnosis not present

## 2022-01-18 DIAGNOSIS — B3731 Acute candidiasis of vulva and vagina: Secondary | ICD-10-CM | POA: Diagnosis not present

## 2022-01-18 DIAGNOSIS — R3914 Feeling of incomplete bladder emptying: Secondary | ICD-10-CM

## 2022-01-18 LAB — BLADDER SCAN AMB NON-IMAGING

## 2022-01-18 MED ORDER — TAMSULOSIN HCL 0.4 MG PO CAPS: 0.4000 mg | ORAL_CAPSULE | Freq: Every day | ORAL | 11 refills | Status: DC

## 2022-01-18 MED ORDER — FLUCONAZOLE 150 MG PO TABS: 150.0000 mg | ORAL_TABLET | Freq: Once | ORAL | 0 refills | Status: AC

## 2022-01-18 NOTE — Progress Notes (Signed)
? ?01/18/2022 ?3:26 PM  ? ?Renee Peck ?10-31-1940 ?[id number] ? ?CC: ?Chief Complaint  ?Patient presents with  ? Acute Visit  ?  Pt states she's unable to urinate  ? ? ?HPI: ?Renee Peck is a 81 y.o. female with PMH urinary intermittency, vaginal atrophy, urethral spasms, uterine cancer, incomplete bladder emptying, urethral stricture, and recurrent UTI who presents today for evaluation of possible urinary retention.  ? ?She was seen in clinic by Dr. Erlene Quan yesterday for a voiding trial after Foley catheter was placed in the emergency department 5 days ago.  It did not appear that she was in frank retention at that time.  Foley catheter was removed and she was counseled to follow-up in clinic with difficulty voiding. ? ?Today she reports she has voided several times since Foley catheter removal but has occasionally had difficulty initiating a stream.  She is very concerned that she is in urinary retention and is asking for Foley catheter placement today.  She states she has been taking Azo and that this medication is allowing her to urinate. ? ?She also reports vulvar irritation and thinks she has a yeast infection. ? ?PVR 211 mL upon arrival.  She subsequently voided.  Repeat PVR 282 mL. ? ?PMH: ?Past Medical History:  ?Diagnosis Date  ? Incomplete bladder emptying   ? Recurrent UTI   ? Urethral stricture   ? Uterine cancer ([place]) 2001  ? ? ?Surgical History: ?Past Surgical History:  ?Procedure Laterality Date  ? ABDOMINAL HYSTERECTOMY    ? bladder polyps    ? BREAST CYST EXCISION    ? CARPAL TUNNEL RELEASE    ? ? ?Home Medications:  ?Allergies as of 01/18/2022   ? ?   Reactions  ? Losartan Other (See Comments)  ? Hypotension.  ? Metronidazole Other (See Comments)  ? Onset 05/24/1999  ? Penicillins Other (See Comments)  ? 05/13/2002  ? ?  ? ?  ?Medication List  ?  ? ?  ? Accurate as of Jan 18, 2022  3:26 PM. If you have any questions, ask your nurse or doctor.  ?  ?  ? ?  ? ?aspirin EC 81 MG tablet ?Take by  mouth. ?  ?calcium citrate-vitamin D 315-200 MG-UNIT tablet ?Commonly known as: CITRACAL+D ?Take by mouth. ?  ?ciprofloxacin 500 MG tablet ?Commonly known as: Cipro ?Take 1 tablet (500 mg total) by mouth 2 (two) times daily for 5 days. ?  ?FISH OIL PO ?Take by mouth. ?  ?fluconazole 150 MG tablet ?Commonly known as: DIFLUCAN ?Take 1 tablet (150 mg total) by mouth once for 1 dose. ?Started by: Debroah Loop, PA-C ?  ?Multi-Vitamins Tabs ?Take by mouth. ?  ?NEOMYCIN-POLYMYXIN-HYDROCORTISONE 1 % Soln OTIC solution ?Commonly known as: CORTISPORIN ?Place in ear(s). ?  ?omeprazole 20 MG capsule ?Commonly known as: PRILOSEC ?Take by mouth. ?  ?tamsulosin 0.4 MG Caps capsule ?Commonly known as: FLOMAX ?Take 1 capsule (0.4 mg total) by mouth daily. ?Started by: Debroah Loop, PA-C ?  ?Tiadylt ER 180 MG 24 hr capsule ?Generic drug: diltiazem ?Take 180 mg by mouth daily. ?  ?triamterene-hydrochlorothiazide 37.5-25 MG capsule ?Commonly known as: DYAZIDE ?Take by mouth. ?What changed: Another medication with the same name was removed. Continue taking this medication, and follow the directions you see here. ?Changed by: Debroah Loop, PA-C ?  ? ?  ? ? ?Allergies:  ?Allergies  ?Allergen Reactions  ? Losartan Other (See Comments)  ?  Hypotension.  ? Metronidazole Other (See Comments)  ?  Onset 05/24/1999  ? Penicillins Other (See Comments)  ?  05/13/2002  ? ? ?Family History: ?Family History  ?Problem Relation Age of Onset  ? Breast cancer Mother 61  ? Breast cancer Sister 51  ?     paternal sister   ? Breast cancer Maternal Aunt 60  ? Breast cancer Cousin   ? Kidney disease Neg Hx   ? Bladder Cancer Neg Hx   ? ? ?Social History:  ? reports that she has never smoked. She has never been exposed to tobacco smoke. She has never used smokeless tobacco. She reports that she does not drink alcohol and does not use drugs. ? ?Physical Exam: ?Ht 5' (1.524 m)   Wt 125 lb (56.7 kg)   BMI 24.41 kg/m?   ?Constitutional:   Alert and oriented, no acute distress, nontoxic appearing ?HEENT: [place], AT ?Cardiovascular: No clubbing, cyanosis, or edema ?Respiratory: Normal respiratory effort, no increased work of breathing ?Skin: No rashes, bruises or suspicious lesions ?Neurologic: Grossly intact, no focal deficits, moving all 4 extremities ?Psychiatric: Normal mood and affect ? ?Laboratory Data: ?Results for orders placed or performed in visit on 01/18/22  ?BLADDER SCAN AMB NON-IMAGING  ?Result Value Ref Range  ? Scan Result 228m   ?Bladder Scan (Post Void Residual) in office  ?Result Value Ref Range  ? Scan Result 2828m  ? ?Assessment & Plan:   ?1. Incomplete bladder emptying ?I had a lengthy conversation with the patient today.  I explained that she is not in urinary retention today that would warrant Foley catheter placement.  She is very perseverant on her voiding symptoms and the conversation was rather circular.  We discussed that she does have incomplete bladder emptying however in the absence of abdominal pain, there is no need for Foley catheter placement.  We also discussed that Azo does not work to prompt voiding and this is likely not the reason that she has been able to urinate since yesterday.  She disagrees with this. ? ?We will start her on Flomax and have her follow-up with Dr. BrErlene Quans scheduled for PVR. ?- BLADDER SCAN AMB NON-IMAGING ?- Bladder Scan (Post Void Residual) in office ?- tamsulosin (FLOMAX) 0.4 MG CAPS capsule; Take 1 capsule (0.4 mg total) by mouth daily.  Dispense: 30 capsule; Refill: 11 ? ?2. Vulvovaginal candidiasis ?1 dose of oral Diflucan provided to the patient for management of possible vulvovaginal candidiasis. ?- fluconazole (DIFLUCAN) 150 MG tablet; Take 1 tablet (150 mg total) by mouth once for 1 dose.  Dispense: 1 tablet; Refill: 0 ? ?Return if symptoms worsen or fail to improve. ? ?SaDebroah LoopPA-C ? ?[address] ?[place]  [phone]

## 2022-01-18 NOTE — Telephone Encounter (Signed)
Incoming call from patient who states that she had a foley catheter removed yesterday, since that time she has been able to void 3 times. She is drinking  4 -5 bottles a day which is her base line. She is seeking reassurance on how many times a day she should void and how much. Advised pt on normal urinary output, advised on signs and symptoms of urinary retention. Pt voiced understanding, advised her to call back around 2pm today to make sure she is continuing to void normally and give her reassurance before the office closes at [facility].  ?

## 2022-01-18 NOTE — Patient Instructions (Signed)
For ongoing vaginal dryness or irritation, try the over-the-counter vaginal moisturizer called Replens. ?

## 2022-02-17 ENCOUNTER — Encounter: Payer: Self-pay | Admitting: Urology

## 2022-02-17 ENCOUNTER — Ambulatory Visit: Payer: Medicare Other | Admitting: Urology

## 2022-02-17 VITALS — BP 112/65 | HR 61 | Ht 60.0 in | Wt 128.0 lb

## 2022-02-17 DIAGNOSIS — R339 Retention of urine, unspecified: Secondary | ICD-10-CM | POA: Diagnosis not present

## 2022-02-17 LAB — URINALYSIS, COMPLETE
Bilirubin, UA: NEGATIVE
Glucose, UA: NEGATIVE
Ketones, UA: NEGATIVE
Leukocytes,UA: NEGATIVE
Nitrite, UA: NEGATIVE
Protein,UA: NEGATIVE
RBC, UA: NEGATIVE
Specific Gravity, UA: 1.02 (ref 1.005–1.030)
Urobilinogen, Ur: 0.2 mg/dL (ref 0.2–1.0)
pH, UA: 8 — ABNORMAL HIGH (ref 5.0–7.5)

## 2022-02-17 LAB — MICROSCOPIC EXAMINATION

## 2022-02-17 LAB — BLADDER SCAN AMB NON-IMAGING

## 2022-02-17 NOTE — Progress Notes (Addendum)
02/17/22 9:48 AM   Renee Peck Sep 09, 1940 [id number]  Referring provider:  Tracie Harrier, [place] [place],  [place] [address]   Urological history  Urinary intermittency  - In office dilation given 03/08/2017   2. Vaginal atrophy  -  Family history of breast cancer   3. Urethral spasms  - Dilation helps with her symptoms   4. Urinary retention/ sensation of incomplete bladder emptying - Seen in ED on 01/16/2022 with severe difficulty urinating - Successful voiding trial on 01/17/2022 - Managed on tamsulosin (Flomax)) 0.4 mg   5. Vulvovaginal candidiasis  - Given 1 dose Diflucan on 01/18/2022.   Chief Complaint  Patient presents with   Follow-up     HPI: Renee Peck is a 81 y.o.female who presents today for 1 month follow-up with UA and PVR.   She reports that she has been taking Flomax and has noticed an increase in urinary leakage.  Patient denies any modifying or aggravating factors.  Patient denies any gross hematuria, dysuria or suprapubic/flank pain.  Patient denies any fevers, chills, nausea or vomiting.   UA benign  PVR 2 ml today.    PMH: Past Medical History:  Diagnosis Date   Incomplete bladder emptying    Recurrent UTI    Urethral stricture    Uterine cancer ([place]) 2001    Surgical History: Past Surgical History:  Procedure Laterality Date   ABDOMINAL HYSTERECTOMY     bladder polyps     BREAST CYST EXCISION     CARPAL TUNNEL RELEASE      Home Medications:  Allergies as of 02/17/2022       Reactions   Losartan Other (See Comments)   Hypotension.   Metronidazole Other (See Comments)   Onset 05/24/1999   Penicillins Other (See Comments)   05/13/2002        Medication List        Accurate as of February 17, 2022  9:48 AM. If you have any questions, ask your nurse or doctor.          STOP taking these medications    Multi-Vitamins Tabs Stopped by: Albaro Deviney, PA-C    NEOMYCIN-POLYMYXIN-HYDROCORTISONE 1 % Soln OTIC solution Commonly known as: CORTISPORIN Stopped by: Zara Council, PA-C       TAKE these medications    aspirin EC 81 MG tablet Take by mouth.   calcium citrate-vitamin D 315-200 MG-UNIT tablet Commonly known as: CITRACAL+D Take by mouth.   FISH OIL PO Take by mouth.   omeprazole 20 MG capsule Commonly known as: PRILOSEC Take by mouth.   PRESERVISION AREDS PO Take by mouth.   tamsulosin 0.4 MG Caps capsule Commonly known as: FLOMAX Take 1 capsule (0.4 mg total) by mouth daily.   Tiadylt ER 180 MG 24 hr capsule Generic drug: diltiazem Take 180 mg by mouth daily.   triamterene-hydrochlorothiazide 37.5-25 MG capsule Commonly known as: DYAZIDE Take by mouth.        Allergies:  Allergies  Allergen Reactions   Losartan Other (See Comments)    Hypotension.   Metronidazole Other (See Comments)    Onset 05/24/1999   Penicillins Other (See Comments)    05/13/2002    Family History: Family History  Problem Relation Age of Onset   Breast cancer Mother 44   Breast cancer Sister 46       paternal sister    Breast cancer Maternal Aunt 78   Breast cancer Cousin  Kidney disease Neg Hx    Bladder Cancer Neg Hx     Social History:  reports that she has never smoked. She has never been exposed to tobacco smoke. She has never used smokeless tobacco. She reports that she does not drink alcohol and does not use drugs.   Physical Exam: BP 112/65   Pulse 61   Ht 5' (1.524 m)   Wt 128 lb (58.1 kg)   BMI 25.00 kg/m   Constitutional:  Well nourished. Alert and oriented, No acute distress. HEENT: [place] AT, moist mucus membranes.  Trachea midline Cardiovascular: No clubbing, cyanosis, or edema. Respiratory: Normal respiratory effort, no increased work of breathing. Neurologic: Grossly intact, no focal deficits, moving all 4 extremities. Psychiatric: Normal mood and affect.    Laboratory Data: Urinalysis Benign. See  Epic.  I have reviewed the labs.    Pertinent Imaging: Results for orders placed or performed in visit on 02/17/22  BLADDER SCAN AMB NON-IMAGING  Result Value Ref Range   Scan Result 62m      Assessment & Plan:    Incomplete bladder emptying  - she is emptying adequately today with PVR of 293m  - She is clear to discontinue taking Flomax as incomplete bladder was likely secondary to infection that has cleared and constipation - BLADDER SCAN AMB NON-IMAGING - follow-up win 3 months with OAB questionnaire and recheck    Return in about 3 months (around 05/20/2022) for three months for oab and pvr.  [address]ola[address] Middle River StreetSuWalnut HilluSeward[place] [id number], KaKirke Shaggyittlejohn,acting as a scribe for SHEncompass Health Rehabilitation Hospital Of Toms RiverPA-C.,have documented all relevant documentation on the behalf of Randolf Sansoucie, PA-C,as directed by  SHCastle Ambulatory Surgery Center LLCPA-C while in the presence of SHFishersvillePA-C.  I have reviewed the above documentation for accuracy and completeness, and I agree with the above.    ShZara CouncilPA-C

## 2022-05-16 NOTE — Progress Notes (Unsigned)
05/17/22 10:51 AM   Renee Peck 1941-04-01 [id number]  Referring provider:  Tracie Harrier, [place] [facility] [place],  [place] [address]   Urological history  Urinary intermittency  - In office dilation given 03/08/2017   2. Vaginal atrophy  -  Family history of breast cancer   3. Urethral spasms  - Dilation helps with her symptoms   4. Urinary retention/ sensation of incomplete bladder emptying - Seen in ED on 01/16/2022 with severe difficulty urinating - Successful voiding trial on 01/17/2022   Chief Complaint  Patient presents with   Follow-up     HPI: Renee Peck is a 81 y.o.female who presents today for 3 month follow-up.  Per her OAB questionnaire, she has 8 or more daytime urinations, 1-2 nighttime urinations with a mild urge to urinate.  She has stress urinary continence w/ leaking 1-2 times weekly.  She wears panty liners daily.  She does not limit fluid intake.  And she occasionally engages in toilet mapping.  Patient denies any modifying or aggravating factors.  Patient denies any gross hematuria, dysuria or suprapubic/flank pain.  Patient denies any fevers, chills, nausea or vomiting.    PVR 46 mL  PMH: Past Medical History:  Diagnosis Date   Incomplete bladder emptying    Recurrent UTI    Urethral stricture    Uterine cancer ([place]) 2001    Surgical History: Past Surgical History:  Procedure Laterality Date   ABDOMINAL HYSTERECTOMY     bladder polyps     BREAST CYST EXCISION     CARPAL TUNNEL RELEASE      Home Medications:  Allergies as of 05/17/2022       Reactions   Losartan Other (See Comments)   Hypotension.   Metronidazole Other (See Comments)   Onset 05/24/1999   Penicillins Other (See Comments)   05/13/2002        Medication List        Accurate as of May 17, 2022 10:51 AM. If you have any questions, ask your nurse or doctor.          aspirin EC 81 MG tablet Take by mouth.    calcium citrate-vitamin D 315-200 MG-UNIT tablet Commonly known as: CITRACAL+D Take by mouth.   FISH OIL PO Take by mouth.   omeprazole 20 MG capsule Commonly known as: PRILOSEC Take by mouth.   PRESERVISION AREDS PO Take by mouth.   tamsulosin 0.4 MG Caps capsule Commonly known as: FLOMAX Take 1 capsule (0.4 mg total) by mouth daily.   Tiadylt ER 180 MG 24 hr capsule Generic drug: diltiazem Take 180 mg by mouth daily.   triamterene-hydrochlorothiazide 37.5-25 MG capsule Commonly known as: DYAZIDE Take by mouth.        Allergies:  Allergies  Allergen Reactions   Losartan Other (See Comments)    Hypotension.   Metronidazole Other (See Comments)    Onset 05/24/1999   Penicillins Other (See Comments)    05/13/2002    Family History: Family History  Problem Relation Age of Onset   Breast cancer Mother 77   Breast cancer Sister 30       paternal sister    Breast cancer Maternal Aunt 60   Breast cancer Cousin    Kidney disease Neg Hx    Bladder Cancer Neg Hx     Social History:  reports that she has never smoked. She has never been exposed to tobacco smoke. She has never used smokeless tobacco.  She reports that she does not drink alcohol and does not use drugs.   Physical Exam: BP (!) 144/80   Pulse 60   Ht 5' (1.524 m)   Wt 126 lb (57.2 kg)   BMI 24.61 kg/m   Constitutional:  Well nourished. Alert and oriented, No acute distress. HEENT: [place] AT, moist mucus membranes.  Trachea midline Cardiovascular: No clubbing, cyanosis, or edema. Respiratory: Normal respiratory effort, no increased work of breathing. Neurologic: Grossly intact, no focal deficits, moving all 4 extremities. Psychiatric: Normal mood and affect.    Laboratory Data: N/A  Pertinent Imaging:  05/17/22 10:24  Scan Result 39m    Assessment & Plan:    Incomplete bladder emptying  - resolved at this time  2. OAB - frequency attributed to HCTZ twice daily   Return in about 1 year  (around 05/18/2023) for PVR and OAB questionnaire.  Avi Archuleta, [address] [place] [phone]

## 2022-05-17 ENCOUNTER — Ambulatory Visit: Payer: Medicare Other | Admitting: Urology

## 2022-05-17 ENCOUNTER — Encounter: Payer: Self-pay | Admitting: Urology

## 2022-05-17 VITALS — BP 144/80 | HR 60 | Ht 60.0 in | Wt 126.0 lb

## 2022-05-17 DIAGNOSIS — N3281 Overactive bladder: Secondary | ICD-10-CM

## 2022-05-17 DIAGNOSIS — R339 Retention of urine, unspecified: Secondary | ICD-10-CM

## 2022-05-17 LAB — BLADDER SCAN AMB NON-IMAGING

## 2022-06-30 IMAGING — MG DIGITAL DIAGNOSTIC BILAT W/ TOMO W/ CAD
6 of 10 series · 6 of 30 positions shown · non-contrast
Comparison: Previous exam(s).

CLINICAL DATA: Patient reports a small palpable nodule in the right
breast, which she has noticed for approximately 2 months. Patient
has a family history of breast cancer diagnosed in her mother 64,
sister at 60 and a maternal aunt.

EXAM:
DIGITAL DIAGNOSTIC BILATERAL MAMMOGRAM WITH TOMOSYNTHESIS AND CAD;
ULTRASOUND RIGHT BREAST LIMITED
TECHNIQUE: Bilateral digital diagnostic mammography and breast tomosynthesis
was performed. The images were evaluated with computer-aided
detection.; Targeted ultrasound examination of the right breast was
performed

[L CC synth-2D]
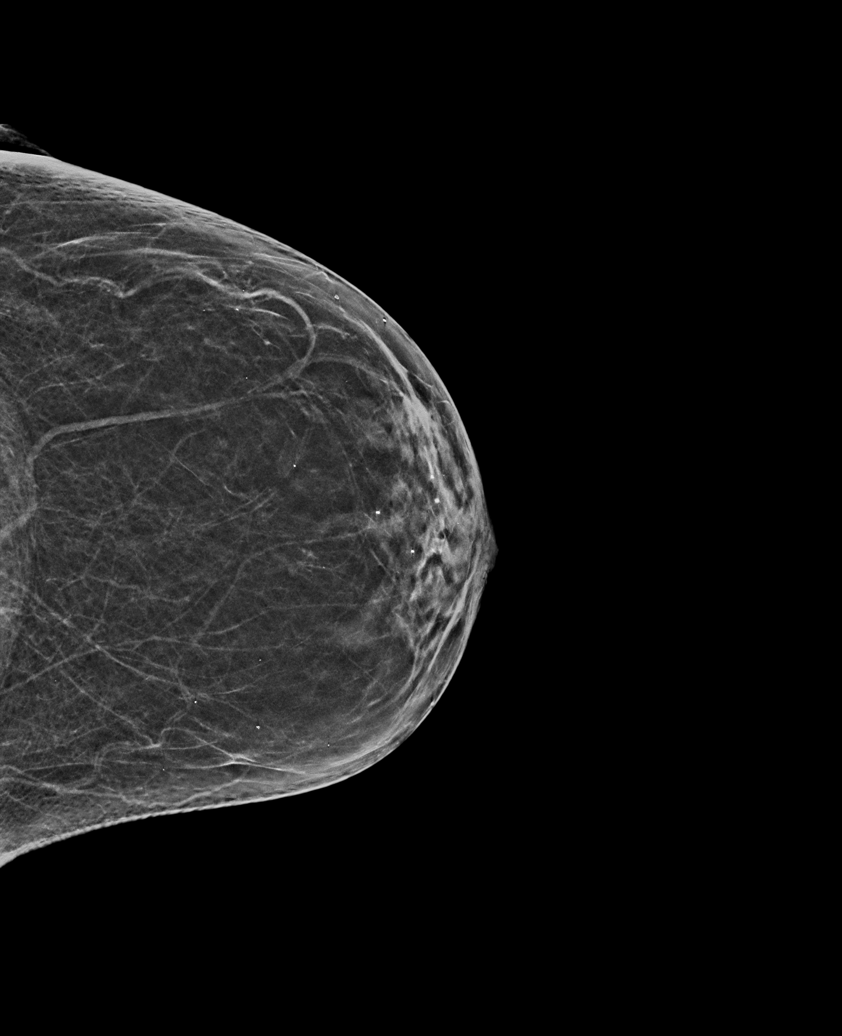

[R TAN synth-2D]
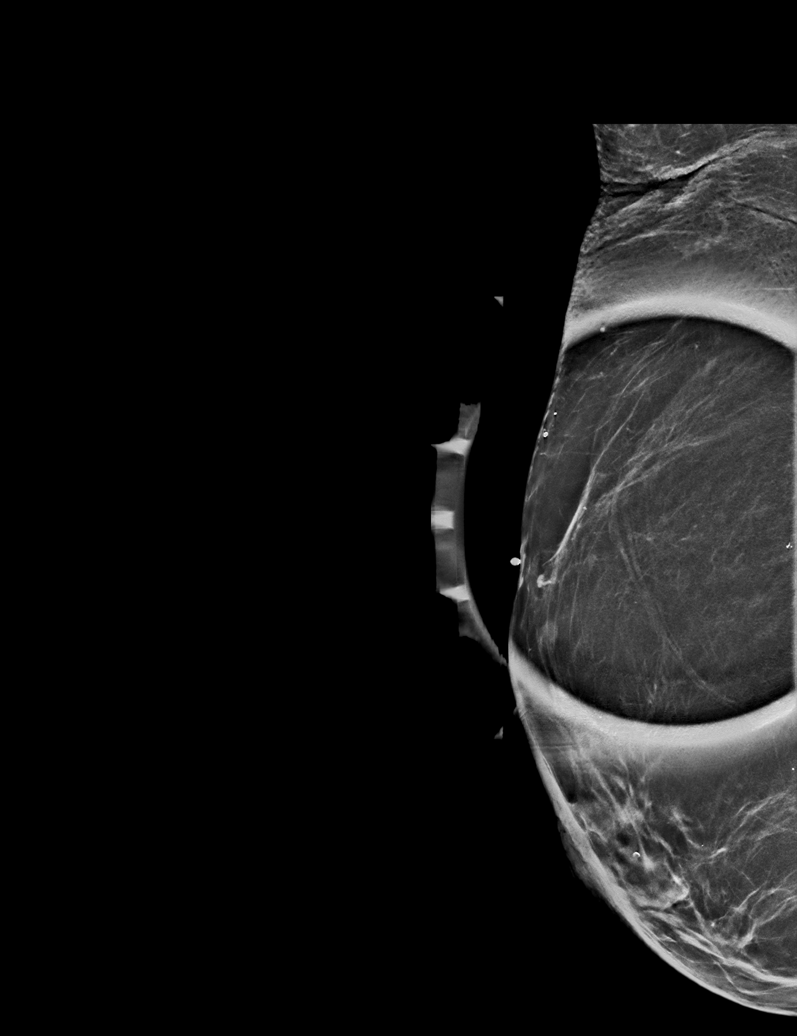

[L MLO synth-2D]
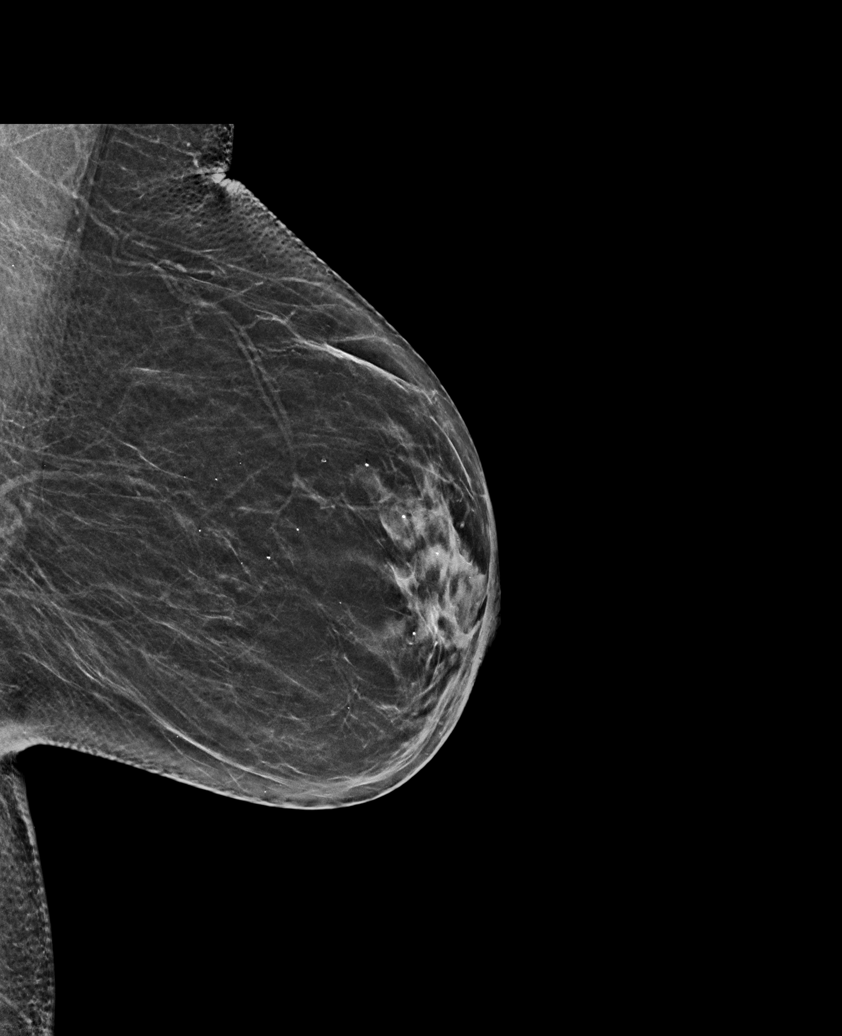

[R CC synth-2D]
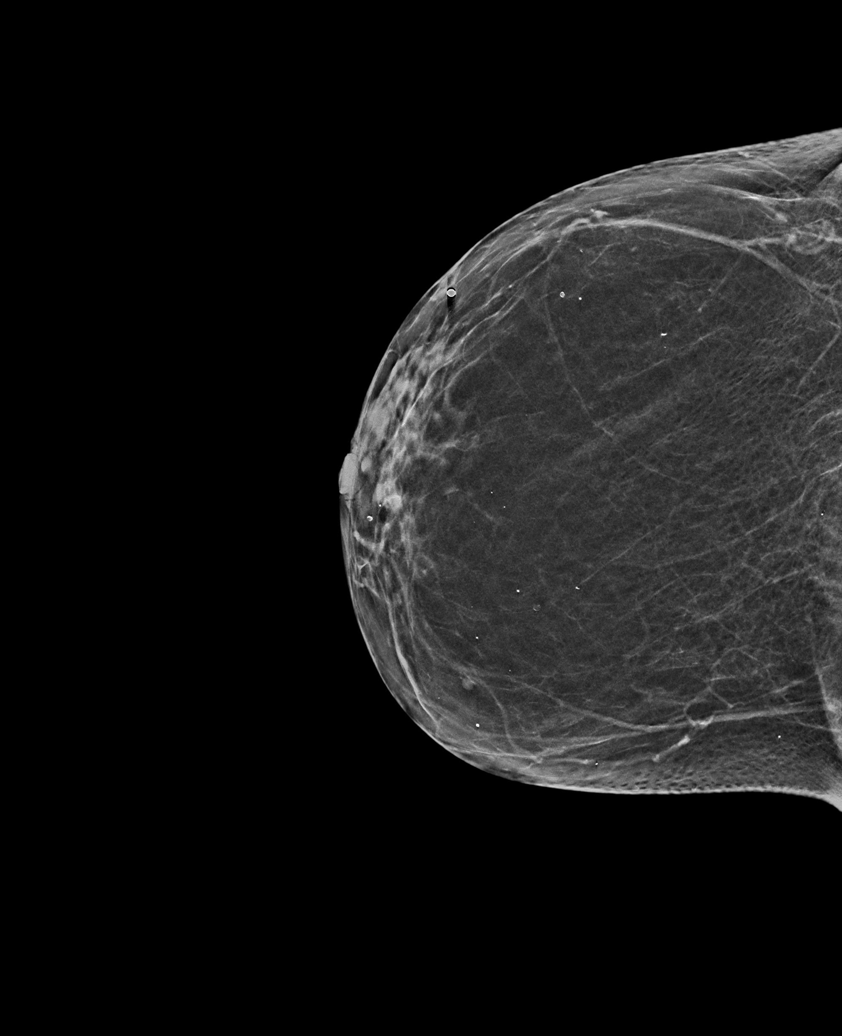

[R MLO synth-2D]
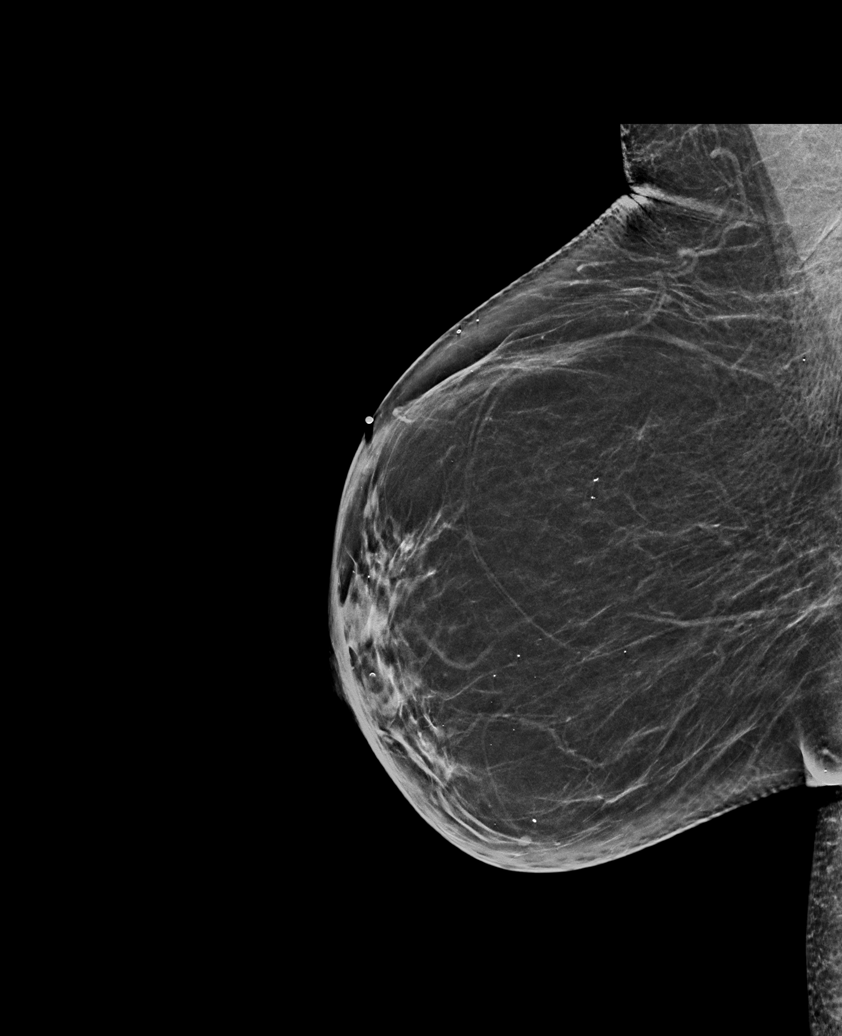

[L MLO tomo · tomo slice 29/56.0]
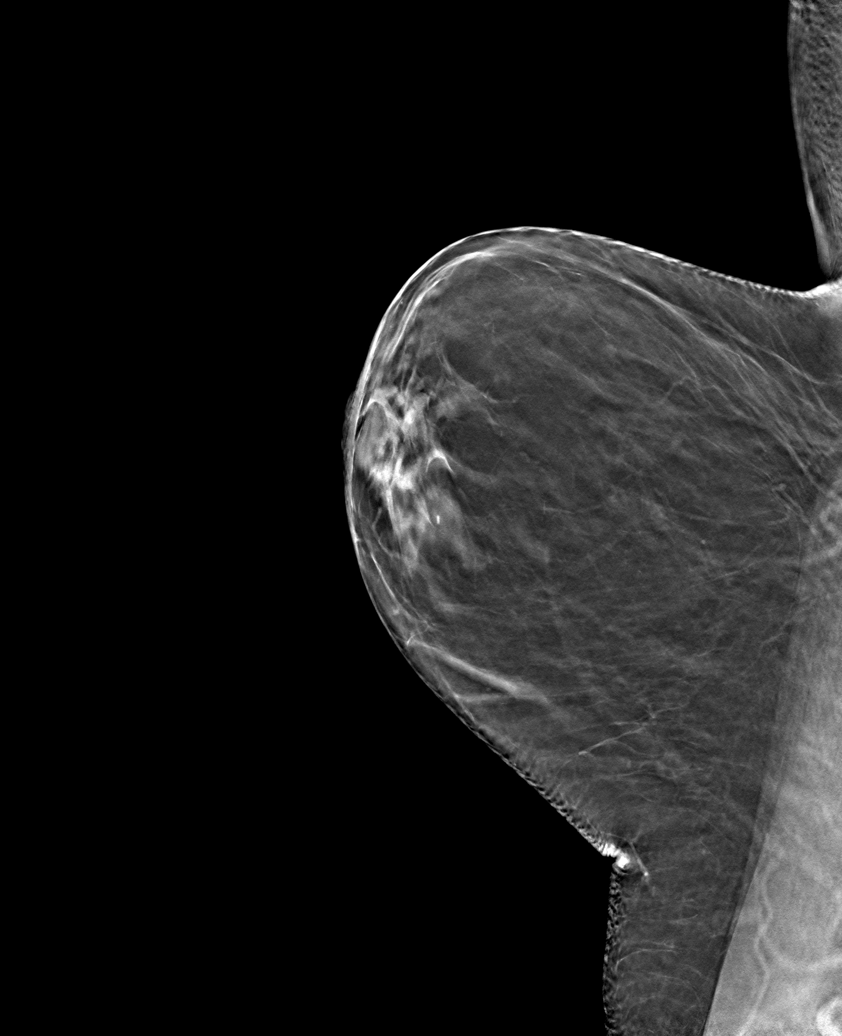

[6 of 30 positions shown; findings below may reference images not displayed]

ACR Breast Density Category b: There are scattered areas of
fibroglandular density.
FINDINGS: There is a small stable superficial intramammary lymph node in the
upper outer right breast corresponding to the palpable abnormality.
There are no other masses, no areas of architectural distortion, no
significant areas of asymmetry and no suspicious calcifications. No
mammographic change.

On physical exam, there is a small superficial firm nodule in the
right breast near 10 o'clock.

Targeted ultrasound is performed, showing a small superficial
intramammary lymph node in the right breast at 10 o'clock, 5 cm from
the nipple, measuring 7 x 3 x 4 mm, corresponding to the palpable
abnormality and the stable lymph node noted mammographically.
IMPRESSION: 1. No evidence of breast malignancy.
2. Small benign intramammary lymph node in the superficial right
breast at 10 o'clock corresponds to the palpable abnormality.

RECOMMENDATION:
Screening mammogram in one year.(Code:YF-A-WFT)

I have discussed the findings and recommendations with the patient.
If applicable, a reminder letter will be sent to the patient
regarding the next appointment.

BI-RADS CATEGORY  2: Benign.

## 2023-05-18 ENCOUNTER — Ambulatory Visit: Payer: Medicare Other | Admitting: Urology

## 2023-05-22 NOTE — Progress Notes (Signed)
05/23/23 11:37 AM   Renee Peck 03-31-1941 [id number]  Referring provider:  Barbette Reichmann, MD [address] [facility] [place],  [place] [address]  Urological history  Urinary intermittency  - In office dilation given 03/08/2017   2. Vaginal atrophy  -  Family history of breast cancer   3. Urethral spasms  - Dilation helps with her symptoms   4. Urinary retention/ sensation of incomplete bladder emptying - Seen in ED on 01/16/2022 with severe difficulty urinating - Successful voiding trial on 01/17/2022  Chief Complaint  Patient presents with   Follow-up    HPI: Renee Peck is a 82 y.o.female who presents today for 12 month follow-up.  Previous records reviewed.   Had an incident of 3 RBC's on March UA.    She is having 8 or more daytime voids, nocturia x 3 or more with a mild urge to urinate.  She has stress incontinence.  She leaks 3 or more times a day.  She wears 2 panty liners daily.  She does not limit fluid intake.  She does engage in toilet mapping.  Patient denies any modifying or aggravating factors.  Patient denies any recent UTI's, gross hematuria, dysuria or suprapubic/flank pain.  Patient denies any fevers, chills, nausea or vomiting.    UA yellow slightly cloudy, specific gravity 1.010, pH 7.0, nitrate positive, 1+ leukocyte, 11-30 WBCs, 0-2 RBCs, 0-10 epithelial cells and many bacteria.    PVR 102 mL  PMH: Past Medical History:  Diagnosis Date   Incomplete bladder emptying    Recurrent UTI    Urethral stricture    Uterine cancer ([facility]) 2001    Surgical History: Past Surgical History:  Procedure Laterality Date   ABDOMINAL HYSTERECTOMY     bladder polyps     BREAST CYST EXCISION     CARPAL TUNNEL RELEASE      Home Medications:  Allergies as of 05/23/2023       Reactions   Losartan Other (See Comments)   Hypotension.   Metronidazole Other (See Comments)   Onset 05/24/1999   Penicillins Other (See Comments)    05/13/2002        Medication List        Accurate as of May 23, 2023 11:37 AM. If you have any questions, ask your nurse or doctor.          aspirin EC 81 MG tablet Take by mouth.   calcium citrate-vitamin D 315-200 MG-UNIT tablet Commonly known as: CITRACAL+D Take by mouth.   FISH OIL PO Take by mouth.   omeprazole 20 MG capsule Commonly known as: PRILOSEC Take by mouth.   PRESERVISION AREDS PO Take by mouth.   tamsulosin 0.4 MG Caps capsule Commonly known as: FLOMAX Take 1 capsule (0.4 mg total) by mouth daily.   Tiadylt ER 180 MG 24 hr capsule Generic drug: diltiazem Take 180 mg by mouth daily.   triamterene-hydrochlorothiazide 37.5-25 MG capsule Commonly known as: DYAZIDE Take by mouth.        Allergies:  Allergies  Allergen Reactions   Losartan Other (See Comments)    Hypotension.   Metronidazole Other (See Comments)    Onset 05/24/1999   Penicillins Other (See Comments)    05/13/2002    Family History: Family History  Problem Relation Age of Onset   Breast cancer Mother 48   Breast cancer Sister 85       paternal sister    Breast cancer Maternal Aunt 53  Breast cancer Cousin    Kidney disease Neg Hx    Bladder Cancer Neg Hx     Social History:  reports that she has never smoked. She has never been exposed to tobacco smoke. She has never used smokeless tobacco. She reports that she does not drink alcohol and does not use drugs.   Physical Exam: BP 134/74   Pulse 61   Ht 5' (1.524 m)   Wt 126 lb (57.2 kg)   BMI 24.61 kg/m   Constitutional:  Well nourished. Alert and oriented, No acute distress. HEENT: [place] AT, moist mucus membranes.  Trachea midline Cardiovascular: No clubbing, cyanosis, or edema. Respiratory: Normal respiratory effort, no increased work of breathing. Neurologic: Grossly intact, no focal deficits, moving all 4 extremities. Psychiatric: Normal mood and affect.    Laboratory Data: Urinalysis  w/Microscopic Order: [id number] Component Ref Range & Units 5 mo ago  Color Colorless, Straw, Light Yellow, Yellow, Dark Yellow Yellow  Clarity Clear Cloudy Abnormal   Specific Gravity 1.005 - 1.030 1.012  pH, Urine 5.0 - 8.0 7.0  Protein, Urinalysis Negative mg/dL Negative  Glucose, Urinalysis Negative mg/dL Negative  Ketones, Urinalysis Negative mg/dL Negative  Blood, Urinalysis Negative Negative  Nitrite, Urinalysis Negative Negative  Leukocyte Esterase, Urinalysis Negative 3+ Abnormal   Bilirubin, Urinalysis Negative Negative  Urobilinogen, Urinalysis 0.2 - 1.0 mg/dL 0.2  WBC, UA <=5 /hpf >528 High   Red Blood Cells, Urinalysis <=3 /hpf 3  Bacteria, Urinalysis 0 - 5 /hpf 5-50 Abnormal   Squamous Epithelial Cells, Urinalysis /hpf 0  Resulting Agency [facility]   Specimen Collected: 11/27/22 09:11   Performed by: [facility] Last Resulted: 11/27/22 09:26  Received From: [facility]  Result Received: 02/19/23 15:07   CBC w/auto Differential (5 Part) Order: [id number] Component Ref Range & Units 6 mo ago  WBC (White Blood Cell Count) 4.1 - 10.2 10^3/uL 6.9  RBC (Red Blood Cell Count) 4.04 - 5.48 10^6/uL 5.13  Hemoglobin 12.0 - 15.0 gm/dL 27.2  Hematocrit 53.6 - 47.0 % 43.6  MCV (Mean Corpuscular Volume) 80.0 - 100.0 fl 85.0  MCH (Mean Corpuscular Hemoglobin) 27.0 - 31.2 pg 29.0  MCHC (Mean Corpuscular Hemoglobin Concentration) 32.0 - 36.0 gm/dL 64.4  Platelet Count 034 - 450 10^3/uL 324  RDW-CV (Red Cell Distribution Width) 11.6 - 14.8 % 12.6  MPV (Mean Platelet Volume) 9.4 - 12.4 fl 8.6 Low   Neutrophils 1.50 - 7.80 10^3/uL 3.60  Lymphocytes 1.00 - 3.60 10^3/uL 2.08  Monocytes 0.00 - 1.50 10^3/uL 0.66  Eosinophils 0.00 - 0.55 10^3/uL 0.45  Basophils 0.00 - 0.09 10^3/uL 0.03  Neutrophil % 32.0 - 70.0 % 52.6  Lymphocyte % 10.0 - 50.0 % 30.4  Monocyte % 4.0 - 13.0 % 9.6  Eosinophil % 1.0 -  5.0 % 6.6 High   Basophil% 0.0 - 2.0 % 0.4  Immature Granulocyte % <=0.7 % 0.4  Immature Granulocyte Count <=0.06 10^3/L 0.03  Resulting Agency [facility]   Specimen Collected: 11/20/22 08:24   Performed by: [facility] Last Resulted: 11/20/22 08:53  Received From: [facility]  Result Received: 02/19/23 15:07   Comprehensive Metabolic Panel (CMP) Order: [id number] Component Ref Range & Units 6 mo ago  Glucose 70 - 110 mg/dL 92  Sodium 756 - 433 mmol/L 135 Low   Potassium 3.6 - 5.1 mmol/L 3.8  Chloride 97 - 109 mmol/L 95 Low   Carbon  Dioxide (CO2) 22.0 - 32.0 mmol/L 29.3  Urea Nitrogen (BUN) 7 - 25 mg/dL 11  Creatinine 0.6 - 1.1 mg/dL 0.7  Glomerular Filtration Rate (eGFR) >60 mL/min/1.73sq m 86  Comment: CKD-EPI (2021) does not include patient's race in the calculation of eGFR.  Monitoring changes of plasma creatinine and eGFR over time is useful for monitoring kidney function.  Interpretive Ranges for eGFR (CKD-EPI 2021):  eGFR:       >60 mL/min/1.73 sq. m - Normal eGFR:       30-59 mL/min/1.73 sq. m - Moderately Decreased eGFR:       15-29 mL/min/1.73 sq. m  - Severely Decreased eGFR:       < 15 mL/min/1.73 sq. m  - Kidney Failure   Note: These eGFR calculations do not apply in acute situations when eGFR is changing rapidly or patients on dialysis.  Calcium 8.7 - 10.3 mg/dL 9.7  AST 8 - 39 U/L 15  ALT 5 - 38 U/L 10  Alk Phos (alkaline Phosphatase) 34 - 104 U/L 81  Albumin 3.5 - 4.8 g/dL 4.4  Bilirubin, Total 0.3 - 1.2 mg/dL 0.6  Protein, Total 6.1 - 7.9 g/dL 6.9  A/G Ratio 1.0 - 5.0 gm/dL 1.8  Resulting Agency [facility]   Specimen Collected: 11/20/22 08:24   Performed by: [facility] Last Resulted: 11/20/22 12:13  Received From: [facility]  Result Received: 02/19/23 15:07    Hemoglobin A1C Order: [id number] Component Ref Range & Units 6 mo ago   Hemoglobin A1C 4.2 - 5.6 % 5.4  Average Blood Glucose (Calc) mg/dL 413  Resulting Agency [facility]  Narrative Performed by [facility] Normal Range:    4.2 - 5.6% Increased Risk:  5.7 - 6.4% Diabetes:        >= 6.5% Glycemic Control for adults with diabetes:  <7%    Specimen Collected: 11/20/22 08:24   Performed by: [facility] Last Resulted: 11/20/22 09:56  Received From: [facility]  Result Received: 02/19/23 15:07      Pertinent Imaging:  05/23/23 11:12  Scan Result 102 ml    Assessment & Plan:    1. Microscopic hematuria -UA today w/o micro heme -We discussed the AUA guidelines recommending a hematuria workup with 3 or more RBCs on UA, we discussed possible causes of microscopic hematuria, such as malignancy, infection and stones.  She does not want to pursue hematuria workup at this time, but she did agree to return in 3 months for repeat UA to see if the microscopic hematuria returns  2. Incomplete bladder emptying  - resolved at this time  3. OAB - frequency attributed to HCTZ twice daily   Return in about 3 months (around 08/22/2023) for UA only .  Cloretta Ned   [facility] [address] [place], [place] [address] [phone]

## 2023-05-23 ENCOUNTER — Encounter: Payer: Self-pay | Admitting: Urology

## 2023-05-23 ENCOUNTER — Ambulatory Visit: Payer: Medicare Other | Admitting: Urology

## 2023-05-23 VITALS — BP 134/74 | HR 61 | Ht 60.0 in | Wt 126.0 lb

## 2023-05-23 DIAGNOSIS — N3281 Overactive bladder: Secondary | ICD-10-CM | POA: Diagnosis not present

## 2023-05-23 DIAGNOSIS — R3129 Other microscopic hematuria: Secondary | ICD-10-CM | POA: Diagnosis not present

## 2023-05-23 DIAGNOSIS — R339 Retention of urine, unspecified: Secondary | ICD-10-CM

## 2023-05-23 LAB — URINALYSIS, COMPLETE
Bilirubin, UA: NEGATIVE
Glucose, UA: NEGATIVE
Ketones, UA: NEGATIVE
Nitrite, UA: POSITIVE — AB
Protein,UA: NEGATIVE
RBC, UA: NEGATIVE
Specific Gravity, UA: 1.01 (ref 1.005–1.030)
Urobilinogen, Ur: 0.2 mg/dL (ref 0.2–1.0)
pH, UA: 7 (ref 5.0–7.5)

## 2023-05-23 LAB — MICROSCOPIC EXAMINATION

## 2023-05-23 LAB — BLADDER SCAN AMB NON-IMAGING: Scan Result: 102

## 2023-07-18 ENCOUNTER — Encounter: Payer: Self-pay | Admitting: Ophthalmology

## 2023-07-23 NOTE — Discharge Instructions (Signed)

## 2023-07-24 ENCOUNTER — Encounter
Admission: RE | Disposition: A | Discharge status: Home / Self Care | Payer: Self-pay | Source: Home / Self Care | Attending: Ophthalmology

## 2023-07-24 ENCOUNTER — Other Ambulatory Visit: Payer: Self-pay

## 2023-07-24 ENCOUNTER — Encounter: Payer: Self-pay | Admitting: Ophthalmology

## 2023-07-24 ENCOUNTER — Ambulatory Visit: Payer: Medicare Other | Admitting: Anesthesiology

## 2023-07-24 ENCOUNTER — Ambulatory Visit
Admission: RE | Admit: 2023-07-24 | Discharge: 2023-07-24 | Disposition: A | Discharge status: Home / Self Care | Payer: Medicare Other | Attending: Ophthalmology | Admitting: Ophthalmology

## 2023-07-24 DIAGNOSIS — K219 Gastro-esophageal reflux disease without esophagitis: Secondary | ICD-10-CM | POA: Diagnosis not present

## 2023-07-24 DIAGNOSIS — I509 Heart failure, unspecified: Secondary | ICD-10-CM | POA: Insufficient documentation

## 2023-07-24 DIAGNOSIS — I110 Hypertensive heart disease with heart failure: Secondary | ICD-10-CM | POA: Diagnosis not present

## 2023-07-24 DIAGNOSIS — H2512 Age-related nuclear cataract, left eye: Secondary | ICD-10-CM | POA: Insufficient documentation

## 2023-07-24 HISTORY — DX: Essential (primary) hypertension: I10

## 2023-07-24 HISTORY — DX: Gastro-esophageal reflux disease without esophagitis: K21.9

## 2023-07-24 HISTORY — PX: CATARACT EXTRACTION W/PHACO: SHX586

## 2023-07-24 HISTORY — DX: Motion sickness, initial encounter: T75.3XXA

## 2023-07-24 SURGERY — PHACOEMULSIFICATION, CATARACT, WITH IOL INSERTION
Anesthesia: Monitor Anesthesia Care | Site: Eye | Laterality: Left

## 2023-07-24 MED ORDER — SIGHTPATH DOSE#1 NA CHONDROIT SULF-NA HYALURON 40-17 MG/ML IO SOLN
INTRAOCULAR | Status: DC | PRN
  Administered 2023-07-24: 1 mL via INTRAOCULAR

## 2023-07-24 MED ORDER — PHENYLEPHRINE HCL 10 % OP SOLN
3.0000 [drp] | OPHTHALMIC | Status: DC | PRN
  Administered 2023-07-24 (×3): 3 [drp] via OPHTHALMIC

## 2023-07-24 MED ORDER — ARMC OPHTHALMIC DILATING DROPS
1.0000 | OPHTHALMIC | Status: DC | PRN
  Administered 2023-07-24 (×3): 1 via OPHTHALMIC

## 2023-07-24 MED ORDER — FENTANYL CITRATE (PF) 100 MCG/2ML IJ SOLN
INTRAMUSCULAR | Status: AC
  Filled 2023-07-24: qty 2

## 2023-07-24 MED ORDER — SIGHTPATH DOSE#1 BSS IO SOLN
INTRAOCULAR | Status: DC | PRN
  Administered 2023-07-24: 15 mL

## 2023-07-24 MED ORDER — BRIMONIDINE TARTRATE-TIMOLOL 0.2-0.5 % OP SOLN
OPHTHALMIC | Status: DC | PRN
  Administered 2023-07-24: 1 [drp] via OPHTHALMIC

## 2023-07-24 MED ORDER — MIDAZOLAM HCL 2 MG/2ML IJ SOLN
INTRAMUSCULAR | Status: AC
  Filled 2023-07-24: qty 2

## 2023-07-24 MED ORDER — TETRACAINE HCL 0.5 % OP SOLN
OPHTHALMIC | Status: AC
  Filled 2023-07-24: qty 4

## 2023-07-24 MED ORDER — SIGHTPATH DOSE#1 BSS IO SOLN
INTRAOCULAR | Status: DC | PRN
  Administered 2023-07-24: 131 mL via OPHTHALMIC

## 2023-07-24 MED ORDER — ARMC OPHTHALMIC DILATING DROPS
OPHTHALMIC | Status: AC
  Filled 2023-07-24: qty 0.5

## 2023-07-24 MED ORDER — TETRACAINE HCL 0.5 % OP SOLN
1.0000 [drp] | OPHTHALMIC | Status: DC | PRN
Start: 2023-07-24 — End: 2023-07-24
  Administered 2023-07-24 (×3): 1 [drp] via OPHTHALMIC

## 2023-07-24 MED ORDER — PHENYLEPHRINE HCL 10 % OP SOLN
OPHTHALMIC | Status: AC
  Filled 2023-07-24: qty 5

## 2023-07-24 MED ORDER — MOXIFLOXACIN HCL 0.5 % OP SOLN
OPHTHALMIC | Status: DC | PRN
  Administered 2023-07-24: .2 mL via OPHTHALMIC

## 2023-07-24 MED ORDER — MIDAZOLAM HCL 2 MG/2ML IJ SOLN
INTRAMUSCULAR | Status: DC | PRN
  Administered 2023-07-24 (×2): 1 mg via INTRAVENOUS

## 2023-07-24 MED ORDER — SIGHTPATH DOSE#1 BSS IO SOLN
INTRAOCULAR | Status: DC | PRN
  Administered 2023-07-24: 1 mL via INTRAMUSCULAR

## 2023-07-24 SURGICAL SUPPLY — 9 items
ANGLE REVERSE CUT SHRT 25GA (CUTTER) ×1
CATARACT SUITE SIGHTPATH (MISCELLANEOUS) ×1
CYSTOTOME ANGL RVRS SHRT 25G (CUTTER) ×1 IMPLANT
FEE CATARACT SUITE SIGHTPATH (MISCELLANEOUS) ×1 IMPLANT
GLOVE BIOGEL PI IND STRL 8 (GLOVE) ×1 IMPLANT
LENS IOL TECNIS EYHANCE 23.0 (Intraocular Lens) IMPLANT
NDL FILTER BLUNT 18X1 1/2 (NEEDLE) ×1 IMPLANT
NEEDLE FILTER BLUNT 18X1 1/2 (NEEDLE) ×1
SYR 3ML LL SCALE MARK (SYRINGE) ×1 IMPLANT

## 2023-07-24 NOTE — Anesthesia Postprocedure Evaluation (Signed)
Anesthesia Post Note  Patient: Renee Peck  Procedure(s) Performed: CATARACT EXTRACTION PHACO AND INTRAOCULAR LENS PLACEMENT (IOC) LEFT 18.31  01:28.9 (Left: Eye)  Patient location during evaluation: PACU Anesthesia Type: MAC Level of consciousness: awake and alert Pain management: pain level controlled Vital Signs Assessment: post-procedure vital signs reviewed and stable Respiratory status: spontaneous breathing, nonlabored ventilation, respiratory function stable and patient connected to nasal cannula oxygen Cardiovascular status: stable and blood pressure returned to baseline Postop Assessment: no apparent nausea or vomiting Anesthetic complications: no   No notable events documented.   Last Vitals:  Vitals:   07/24/23 1254 07/24/23 1300  BP: 119/68 128/67  Pulse: (!) 54 (!) 48  Resp: 19 13  Temp:    SpO2: 99% 100%    Last Pain:  Vitals:   07/24/23 1257  TempSrc:   PainSc: 0-No pain                 Ertha Nabor C Venson Ferencz

## 2023-07-24 NOTE — Anesthesia Preprocedure Evaluation (Addendum)
Anesthesia Evaluation  Patient identified by MRN, date of birth, ID band Patient awake    Reviewed: Allergy & Precautions, H&P , NPO status , Patient's Chart, lab work & pertinent test results  Airway Mallampati: III  TM Distance: <3 FB Neck ROM: Full    Dental no notable dental hx. (+) Caps Upper caps/crowns central incisors, lateral incisors:   Pulmonary neg pulmonary ROS   Pulmonary exam normal breath sounds clear to auscultation       Cardiovascular hypertension, +CHF  Normal cardiovascular exam Rhythm:Regular Rate:Normal     Neuro/Psych negative neurological ROS  negative psych ROS   GI/Hepatic negative GI ROS, Neg liver ROS,GERD  ,,  Endo/Other  negative endocrine ROS    Renal/GU negative Renal ROS  negative genitourinary   Musculoskeletal negative musculoskeletal ROS (+) Arthritis ,    Abdominal   Peds negative pediatric ROS (+)  Hematology negative hematology ROS (+)   Anesthesia Other Findings Recurrent UTI  Urethral stricture Incomplete bladder emptying  Uterine cancer (HCC) Hypertension  GERD (gastroesophageal reflux disease) Severe motion sickness Minor head tremor noted  Reproductive/Obstetrics negative OB ROS                             Anesthesia Physical Anesthesia Plan  ASA: 2  Anesthesia Plan: MAC   Post-op Pain Management:    Induction: Intravenous  PONV Risk Score and Plan:   Airway Management Planned: Natural Airway and Nasal Cannula  Additional Equipment:   Intra-op Plan:   Post-operative Plan:   Informed Consent: I have reviewed the patients History and Physical, chart, labs and discussed the procedure including the risks, benefits and alternatives for the proposed anesthesia with the patient or authorized representative who has indicated his/her understanding and acceptance.     Dental Advisory Given  Plan Discussed with: Anesthesiologist,  CRNA and Surgeon  Anesthesia Plan Comments: (Patient consented for risks of anesthesia including but not limited to:  - adverse reactions to medications - damage to eyes, teeth, lips or other oral mucosa - nerve damage due to positioning  - sore throat or hoarseness - Damage to heart, brain, nerves, lungs, other parts of body or loss of life  Patient voiced understanding and assent.)        Anesthesia Quick Evaluation

## 2023-07-24 NOTE — Transfer of Care (Signed)
Immediate Anesthesia Transfer of Care Note  Patient: Renee Peck  Procedure(s) Performed: CATARACT EXTRACTION PHACO AND INTRAOCULAR LENS PLACEMENT (IOC) LEFT 18.31  01:28.9 (Left: Eye)  Patient Location: PACU  Anesthesia Type:MAC  Level of Consciousness: awake, alert , and oriented  Airway & Oxygen Therapy: Patient Spontanous Breathing  Post-op Assessment: Report given to RN and Post -op Vital signs reviewed and stable  Post vital signs: Reviewed and stable  Last Vitals:  Vitals Value Taken Time  BP    Temp    Pulse    Resp    SpO2      Last Pain:  Vitals:   07/24/23 1009  TempSrc: Temporal  PainSc: 0-No pain         Complications: No notable events documented.

## 2023-07-24 NOTE — H&P (Signed)
[facility]   Primary Care Physician:  Barbette Reichmann, MD Ophthalmologist: Dr. Druscilla Brownie  Pre-Procedure History & Physical: HPI:  Renee Peck is a 82 y.o. female here for cataract surgery.   Past Medical History:  Diagnosis Date   GERD (gastroesophageal reflux disease)    Hypertension    Incomplete bladder emptying    Motion sickness    Recurrent UTI    Urethral stricture    Uterine cancer ([facility]) 2001    Past Surgical History:  Procedure Laterality Date   ABDOMINAL HYSTERECTOMY     bladder polyps     BREAST CYST EXCISION     CARPAL TUNNEL RELEASE      Prior to Admission medications   Medication Sig Start Date End Date Taking? Authorizing Provider  acetaminophen (TYLENOL) 325 MG tablet Take 325 mg by mouth every 6 (six) hours as needed.   Yes [provider]  aspirin EC 81 MG tablet Take by mouth.   Yes [provider]  calcium citrate-vitamin D (CITRACAL+D) 315-200 MG-UNIT tablet Take by mouth.   Yes [provider]  Cholecalciferol 25 MCG (1000 UT) TBDP Take by mouth daily.   Yes [provider]  diphenhydrAMINE (BENADRYL) 25 mg capsule Take 25 mg by mouth every 6 (six) hours as needed.   Yes [provider]  Multiple Vitamin (MULTIVITAMIN) tablet Take 1 tablet by mouth daily.   Yes [provider]  Multiple Vitamins-Minerals (PRESERVISION AREDS PO) Take by mouth.   Yes [provider]  Omega-3 Fatty Acids (FISH OIL PO) Take by mouth.   Yes [provider]  omeprazole (PRILOSEC) 20 MG capsule Take by mouth. 05/24/15  Yes [provider]  TIADYLT ER 180 MG 24 hr capsule Take 120 mg by mouth daily. 11/23/21  Yes [provider]  triamterene-hydrochlorothiazide (DYAZIDE) 37.5-25 MG capsule Take by mouth. 11/22/21 07/24/23 Yes [provider]    Allergies as of 07/06/2023 - Review Complete 05/23/2023  Allergen Reaction Noted   Losartan Other (See Comments) 04/29/2014    Metronidazole Other (See Comments) 04/29/2014   Penicillins Other (See Comments) 04/29/2014    Family History  Problem Relation Age of Onset   Breast cancer Mother 53   Breast cancer Sister 43       paternal sister    Breast cancer Maternal Aunt 82   Breast cancer Cousin    Kidney disease Neg Hx    Bladder Cancer Neg Hx     Social History   Socioeconomic History   Marital status: Single    Spouse name: Not on file   Number of children: Not on file   Years of education: Not on file   Highest education level: Not on file  Occupational History   Not on file  Tobacco Use   Smoking status: Never    Passive exposure: Never   Smokeless tobacco: Never  Vaping Use   Vaping status: Never Used  Substance and Sexual Activity   Alcohol use: No   Drug use: No   Sexual activity: Not Currently  Other Topics Concern   Not on file  Social History Narrative   Not on file   Social Determinants of Health   Financial Resource Strain: Low Risk  (06/20/2023)   Received from [facility]   Overall Financial Resource Strain (CARDIA)    Difficulty of Paying Living Expenses: Not hard at all  Food Insecurity: No Food Insecurity (06/20/2023)   Received from [facility]   Hunger Vital Sign    Worried About Running Out of Food in the Last Year: Never true    Ran Out of Food in the Last Year: Never true  Transportation Needs: No Transportation Needs (06/20/2023)   [facility] from [facility]    In the past 12 months, has lack of transportation kept you from medical appointments or from getting medications?: No    Lack of Transportation (Non-Medical): No  Physical Activity: Not on file  Stress: Not on file  Social Connections: Not on file  Intimate Partner Violence: Not on file    Review of Systems: See HPI, otherwise negative ROS  Physical Exam: BP (!) 163/67   Temp 97.9 F (36.6 C) (Temporal)   Resp  11   Ht 5' (1.524 m)   Wt 58.2 kg   SpO2 99%   BMI 25.08 kg/m  General:   Alert, cooperative in NAD Head:  Normocephalic and atraumatic. Respiratory:  Normal work of breathing. Cardiovascular:  RRR  Impression/Plan: Renee Peck is here for cataract surgery.  Risks, benefits, limitations, and alternatives regarding cataract surgery have been reviewed with the patient.  Questions have been answered.  All parties agreeable.   Galen Manila, MD  07/24/2023, 11:32 AM

## 2023-07-24 NOTE — Op Note (Signed)
PREOPERATIVE DIAGNOSIS:  Nuclear sclerotic cataract of the left eye.   POSTOPERATIVE DIAGNOSIS:  Nuclear sclerotic cataract of the left eye.   OPERATIVE PROCEDURE:ORPROCALL@   SURGEON:  Galen Manila, MD.   ANESTHESIA:  Anesthesiologist: Marisue Humble, MD CRNA: Lanell Matar, CRNA  1.      Managed anesthesia care. 2.     0.20ml of Shugarcaine was instilled following the paracentesis   COMPLICATIONS:  None.   TECHNIQUE:   Stop and chop   DESCRIPTION OF PROCEDURE:  The patient was examined and consented in the preoperative holding area where the aforementioned topical anesthesia was applied to the left eye and then brought back to the Operating Room where the left eye was prepped and draped in the usual sterile ophthalmic fashion and a lid speculum was placed. A paracentesis was created with the side port blade and the anterior chamber was filled with viscoelastic. A near clear corneal incision was performed with the steel keratome. A continuous curvilinear capsulorrhexis was performed with a cystotome followed by the capsulorrhexis forceps. Hydrodissection and hydrodelineation were carried out with BSS on a blunt cannula. The lens was removed in a stop and chop  technique and the remaining cortical material was removed with the irrigation-aspiration handpiece. The capsular bag was inflated with viscoelastic and the Technis ZCB00 lens was placed in the capsular bag without complication. The remaining viscoelastic was removed from the eye with the irrigation-aspiration handpiece. The wounds were hydrated. The anterior chamber was flushed with BSS and the eye was inflated to physiologic pressure. 0.34ml Vigamox was placed in the anterior chamber. The wounds were found to be water tight. The eye was dressed with Combigan. The patient was given protective glasses to wear throughout the day and a shield with which to sleep tonight. The patient was also given drops with which to begin a drop  regimen today and will follow-up with me in one day. Implant Name Type Inv. Item Serial No. Manufacturer Lot No. LRB No. Used Action  LENS IOL TECNIS EYHANCE 23.0 - [id number] Intraocular Lens LENS IOL TECNIS EYHANCE 23.0 [id number] [employer]  Left 1 Implanted    Procedure(s) with comments: CATARACT EXTRACTION PHACO AND INTRAOCULAR LENS PLACEMENT (IOC) LEFT 18.31  01:28.9 (Left) - Latex  Electronically signed: Galen Manila 07/24/2023 12:49 PM

## 2023-07-25 ENCOUNTER — Encounter: Payer: Self-pay | Admitting: Ophthalmology

## 2023-08-01 NOTE — Discharge Instructions (Signed)

## 2023-08-07 ENCOUNTER — Ambulatory Visit: Payer: Medicare Other | Admitting: Anesthesiology

## 2023-08-07 ENCOUNTER — Encounter
Admission: RE | Disposition: A | Discharge status: Home / Self Care | Payer: Self-pay | Source: Home / Self Care | Attending: Ophthalmology

## 2023-08-07 ENCOUNTER — Ambulatory Visit
Admission: RE | Admit: 2023-08-07 | Discharge: 2023-08-07 | Disposition: A | Discharge status: Home / Self Care | Payer: Medicare Other | Attending: Ophthalmology | Admitting: Ophthalmology

## 2023-08-07 DIAGNOSIS — H2511 Age-related nuclear cataract, right eye: Secondary | ICD-10-CM | POA: Insufficient documentation

## 2023-08-07 DIAGNOSIS — I10 Essential (primary) hypertension: Secondary | ICD-10-CM | POA: Insufficient documentation

## 2023-08-07 DIAGNOSIS — K219 Gastro-esophageal reflux disease without esophagitis: Secondary | ICD-10-CM | POA: Diagnosis not present

## 2023-08-07 DIAGNOSIS — E1136 Type 2 diabetes mellitus with diabetic cataract: Secondary | ICD-10-CM | POA: Diagnosis not present

## 2023-08-07 HISTORY — PX: CATARACT EXTRACTION W/PHACO: SHX586

## 2023-08-07 SURGERY — PHACOEMULSIFICATION, CATARACT, WITH IOL INSERTION
Anesthesia: Monitor Anesthesia Care | Site: Eye | Laterality: Right

## 2023-08-07 MED ORDER — BRIMONIDINE TARTRATE-TIMOLOL 0.2-0.5 % OP SOLN
OPHTHALMIC | Status: DC | PRN
  Administered 2023-08-07: 1 [drp] via OPHTHALMIC

## 2023-08-07 MED ORDER — TETRACAINE HCL 0.5 % OP SOLN
1.0000 [drp] | OPHTHALMIC | Status: DC | PRN
  Administered 2023-08-07 (×3): 1 [drp] via OPHTHALMIC

## 2023-08-07 MED ORDER — SIGHTPATH DOSE#1 BSS IO SOLN
INTRAOCULAR | Status: DC | PRN
  Administered 2023-08-07: 2 mL

## 2023-08-07 MED ORDER — TETRACAINE HCL 0.5 % OP SOLN
OPHTHALMIC | Status: AC
  Filled 2023-08-07: qty 4

## 2023-08-07 MED ORDER — SIGHTPATH DOSE#1 NA CHONDROIT SULF-NA HYALURON 40-17 MG/ML IO SOLN
INTRAOCULAR | Status: DC | PRN
  Administered 2023-08-07: 1 mL via INTRAOCULAR

## 2023-08-07 MED ORDER — MIDAZOLAM HCL 2 MG/2ML IJ SOLN
INTRAMUSCULAR | Status: AC
  Filled 2023-08-07: qty 2

## 2023-08-07 MED ORDER — SIGHTPATH DOSE#1 BSS IO SOLN
INTRAOCULAR | Status: DC | PRN
  Administered 2023-08-07: 68 mL via OPHTHALMIC

## 2023-08-07 MED ORDER — MIDAZOLAM HCL 2 MG/2ML IJ SOLN
INTRAMUSCULAR | Status: DC | PRN
  Administered 2023-08-07 (×2): 1 mg via INTRAVENOUS

## 2023-08-07 MED ORDER — FENTANYL CITRATE (PF) 100 MCG/2ML IJ SOLN
INTRAMUSCULAR | Status: AC
  Filled 2023-08-07: qty 2

## 2023-08-07 MED ORDER — SIGHTPATH DOSE#1 BSS IO SOLN
INTRAOCULAR | Status: DC | PRN
  Administered 2023-08-07: 15 mL via INTRAOCULAR

## 2023-08-07 MED ORDER — MOXIFLOXACIN HCL 0.5 % OP SOLN
OPHTHALMIC | Status: DC | PRN
  Administered 2023-08-07: .2 mL via OPHTHALMIC

## 2023-08-07 MED ORDER — ARMC OPHTHALMIC DILATING DROPS
1.0000 | OPHTHALMIC | Status: DC | PRN
Start: 2023-08-07 — End: 2023-08-07
  Administered 2023-08-07 (×3): 1 via OPHTHALMIC

## 2023-08-07 SURGICAL SUPPLY — 12 items
ANGLE REVERSE CUT SHRT 25GA (CUTTER) ×1
CANNULA ANT/CHMB 27G (MISCELLANEOUS) IMPLANT
CANNULA ANT/CHMB 27GA (MISCELLANEOUS)
CATARACT SUITE SIGHTPATH (MISCELLANEOUS) ×1
CYSTOTOME ANGL RVRS SHRT 25G (CUTTER) ×1 IMPLANT
FEE CATARACT SUITE SIGHTPATH (MISCELLANEOUS) ×1 IMPLANT
GLOVE BIOGEL PI IND STRL 8 (GLOVE) ×1 IMPLANT
GLOVE SURG LX STRL 8.0 MICRO (GLOVE) ×1 IMPLANT
LENS IOL TECNIS EYHANCE 22.5 (Intraocular Lens) IMPLANT
NDL FILTER BLUNT 18X1 1/2 (NEEDLE) ×1 IMPLANT
NEEDLE FILTER BLUNT 18X1 1/2 (NEEDLE) ×1
SYR 3ML LL SCALE MARK (SYRINGE) ×1 IMPLANT

## 2023-08-07 NOTE — Op Note (Signed)
PREOPERATIVE DIAGNOSIS:  Nuclear sclerotic cataract of the right eye.   POSTOPERATIVE DIAGNOSIS:  Cataract   OPERATIVE PROCEDURE:ORPROCALL@   SURGEON:  Renee Manila, MD.   ANESTHESIA:  Anesthesiologist: Yevette Edwards, MD CRNA: Lanell Matar, CRNA  1.      Managed anesthesia care. 2.      0.30ml of Shugarcaine was instilled in the eye following the paracentesis.   COMPLICATIONS:  None.   TECHNIQUE:   Stop and chop   DESCRIPTION OF PROCEDURE:  The patient was examined and consented in the preoperative holding area where the aforementioned topical anesthesia was applied to the right eye and then brought back to the Operating Room where the right eye was prepped and draped in the usual sterile ophthalmic fashion and a lid speculum was placed. A paracentesis was created with the side port blade and the anterior chamber was filled with viscoelastic. A near clear corneal incision was performed with the steel keratome. A continuous curvilinear capsulorrhexis was performed with a cystotome followed by the capsulorrhexis forceps. Hydrodissection and hydrodelineation were carried out with BSS on a blunt cannula. The lens was removed in a stop and chop  technique and the remaining cortical material was removed with the irrigation-aspiration handpiece. The capsular bag was inflated with viscoelastic and the Technis ZCB00  lens was placed in the capsular bag without complication. The remaining viscoelastic was removed from the eye with the irrigation-aspiration handpiece. The wounds were hydrated. The anterior chamber was flushed with BSS and the eye was inflated to physiologic pressure. 0.44ml of Vigamox was placed in the anterior chamber. The wounds were found to be water tight. The eye was dressed with Combigan. The patient was given protective glasses to wear throughout the day and a shield with which to sleep tonight. The patient was also given drops with which to begin a drop regimen today and will  follow-up with me in one day. Implant Name Type Inv. Item Serial No. Manufacturer Lot No. LRB No. Used Action  LENS IOL TECNIS EYHANCE 22.5 - [id number] Intraocular Lens LENS IOL TECNIS EYHANCE 22.5 [id number] [employer]  Right 1 Implanted   Procedure(s) with comments: CATARACT EXTRACTION PHACO AND INTRAOCULAR LENS PLACEMENT (IOC) RIGHT  8.79 00:58.1 (Right) - Latex  Electronically signed: Galen Peck 08/07/2023 8:16 AM

## 2023-08-07 NOTE — Anesthesia Preprocedure Evaluation (Signed)
Anesthesia Evaluation  Patient identified by MRN, date of birth, ID band Patient awake    Reviewed: Allergy & Precautions, H&P , NPO status , Patient's Chart, lab work & pertinent test results, reviewed documented beta blocker date and time   Airway Mallampati: II  TM Distance: >3 FB Neck ROM: full    Dental no notable dental hx. (+) Teeth Intact   Pulmonary neg pulmonary ROS   Pulmonary exam normal breath sounds clear to auscultation       Cardiovascular Exercise Tolerance: Good hypertension, On Medications negative cardio ROS  Rhythm:regular Rate:Normal     Neuro/Psych negative neurological ROS  negative psych ROS   GI/Hepatic Neg liver ROS,GERD  Medicated,,  Endo/Other  negative endocrine ROSdiabetes, Well Controlled    Renal/GU      Musculoskeletal   Abdominal   Peds  Hematology negative hematology ROS (+)   Anesthesia Other Findings   Reproductive/Obstetrics negative OB ROS                             Anesthesia Physical Anesthesia Plan  ASA: 2  Anesthesia Plan: MAC   Post-op Pain Management:    Induction:   PONV Risk Score and Plan: 2  Airway Management Planned:   Additional Equipment:   Intra-op Plan:   Post-operative Plan:   Informed Consent: I have reviewed the patients History and Physical, chart, labs and discussed the procedure including the risks, benefits and alternatives for the proposed anesthesia with the patient or authorized representative who has indicated his/her understanding and acceptance.       Plan Discussed with: CRNA  Anesthesia Plan Comments:        Anesthesia Quick Evaluation

## 2023-08-07 NOTE — H&P (Signed)
[facility]   Primary Care Physician:  Barbette Reichmann, MD Ophthalmologist: Dr. Druscilla Brownie  Pre-Procedure History & Physical: HPI:  Renee Peck is a 82 y.o. female here for cataract surgery.   Past Medical History:  Diagnosis Date   GERD (gastroesophageal reflux disease)    Hypertension    Incomplete bladder emptying    Motion sickness    Recurrent UTI    Urethral stricture    Uterine cancer (HCC) 2001    Past Surgical History:  Procedure Laterality Date   ABDOMINAL HYSTERECTOMY     bladder polyps     BREAST CYST EXCISION     CARPAL TUNNEL RELEASE     CATARACT EXTRACTION W/PHACO Left 07/24/2023   Procedure: CATARACT EXTRACTION PHACO AND INTRAOCULAR LENS PLACEMENT (IOC) LEFT 18.31  01:28.9;  Surgeon: Galen Manila, MD;  Location: [facility];  Service: Ophthalmology;  Laterality: Left;  Latex    Prior to Admission medications   Medication Sig Start Date End Date Taking? Authorizing Provider  acetaminophen (TYLENOL) 325 MG tablet Take 325 mg by mouth every 6 (six) hours as needed.   Yes [provider]  aspirin EC 81 MG tablet Take by mouth.   Yes [provider]  calcium citrate-vitamin D (CITRACAL+D) 315-200 MG-UNIT tablet Take by mouth.   Yes [provider]  Cholecalciferol 25 MCG (1000 UT) TBDP Take by mouth daily.   Yes [provider]  diphenhydrAMINE (BENADRYL) 25 mg capsule Take 25 mg by mouth every 6 (six) hours as needed.   Yes [provider]  Multiple Vitamin (MULTIVITAMIN) tablet Take 1 tablet by mouth daily.   Yes [provider]  Multiple Vitamins-Minerals (PRESERVISION AREDS PO) Take by mouth.   Yes [provider]  Omega-3 Fatty Acids (FISH OIL PO) Take by mouth.   Yes [provider]  omeprazole (PRILOSEC) 20 MG capsule Take by mouth. 05/24/15  Yes [provider]  TIADYLT ER 180 MG 24 hr capsule Take 120 mg by mouth daily. 11/23/21  Yes [provider]  triamterene-hydrochlorothiazide (DYAZIDE) 37.5-25 MG capsule Take by mouth. 11/22/21 08/07/23 Yes [provider]    Allergies as of 07/06/2023 - Review Complete 05/23/2023  Allergen Reaction Noted   Losartan Other (See Comments) 04/29/2014   Metronidazole Other (See Comments) 04/29/2014   Penicillins Other (See Comments) 04/29/2014    Family History  Problem Relation Age of Onset   Breast cancer Mother 71   Breast cancer Sister 70       paternal sister    Breast cancer Maternal Aunt 67   Breast cancer Cousin    Kidney disease Neg Hx    Bladder Cancer Neg Hx     Social History   Socioeconomic History   Marital status: Single    Spouse name: Not on file   Number of children: Not on file   Years of education: Not on file   Highest education level: Not on file  Occupational History   Not on file  Tobacco Use   Smoking status: Never    Passive exposure: Never   Smokeless tobacco: Never  Vaping Use   Vaping status: Never Used  Substance and Sexual Activity   Alcohol use: No   Drug use: No   Sexual activity: Not Currently  Other Topics Concern   Not on file  Social History Narrative   Not on file   Social Determinants of Health   Financial Resource Strain: Low Risk  (06/20/2023)  Received from [facility]   Overall Financial Resource Strain (CARDIA)    Difficulty of Paying Living Expenses: Not hard at all  Food Insecurity: No Food Insecurity (06/20/2023)   Received from [facility]   Hunger Vital Sign    Worried About Running Out of Food in the Last Year: Never true    Ran Out of Food in the Last Year: Never true  Transportation Needs: No Transportation Needs (06/20/2023)   Received [facility] - [facility]    In the past 12 months, has lack of transportation kept you from medical appointments or from getting medications?: No    Lack of Transportation (Non-Medical):  No  Physical Activity: Not on file  Stress: Not on file  Social Connections: Not on file  Intimate Partner Violence: Not on file    Review of Systems: See HPI, otherwise negative ROS  Physical Exam: BP (!) 148/64   Pulse 70   Temp 98.1 F (36.7 C)   Resp 16   Ht 5' (1.524 m)   Wt 58.1 kg   SpO2 99%   BMI 25.00 kg/m  General:   Alert, cooperative in NAD Head:  Normocephalic and atraumatic. Respiratory:  Normal work of breathing. Cardiovascular:  RRR  Impression/Plan: Renee Peck is here for cataract surgery.  Risks, benefits, limitations, and alternatives regarding cataract surgery have been reviewed with the patient.  Questions have been answered.  All parties agreeable.   Galen Manila, MD  08/07/2023, 7:49 AM

## 2023-08-07 NOTE — Anesthesia Postprocedure Evaluation (Signed)
Anesthesia Post Note  Patient: Renee Peck  Procedure(s) Performed: CATARACT EXTRACTION PHACO AND INTRAOCULAR LENS PLACEMENT (IOC) RIGHT  8.79 00:58.1 (Right: Eye)  Patient location during evaluation: PACU Anesthesia Type: MAC Level of consciousness: awake and alert Pain management: pain level controlled Vital Signs Assessment: post-procedure vital signs reviewed and stable Respiratory status: spontaneous breathing, nonlabored ventilation, respiratory function stable and patient connected to nasal cannula oxygen Cardiovascular status: stable and blood pressure returned to baseline Postop Assessment: no apparent nausea or vomiting Anesthetic complications: no   No notable events documented.   Last Vitals:  Vitals:   08/07/23 0817 08/07/23 0823  BP: 118/62 (!) 114/58  Pulse: (!) 53 (!) 51  Resp: 18 17  Temp: 36.8 C (!) 36.4 C  SpO2: 95% 98%    Last Pain:  Vitals:   08/07/23 0823  PainSc: 0-No pain                 Yevette Edwards

## 2023-08-07 NOTE — Transfer of Care (Signed)
Immediate Anesthesia Transfer of Care Note  Patient: Renee Peck  Procedure(s) Performed: CATARACT EXTRACTION PHACO AND INTRAOCULAR LENS PLACEMENT (IOC) RIGHT  8.79 00:58.1 (Right: Eye)  Patient Location: PACU  Anesthesia Type:MAC  Level of Consciousness: awake, alert , and oriented  Airway & Oxygen Therapy: Patient Spontanous Breathing  Post-op Assessment: Report given to RN, Post -op Vital signs reviewed and stable, and Patient moving all extremities X 4  Post vital signs: Reviewed and stable  Last Vitals:  Vitals Value Taken Time  BP    Temp    Pulse 54 08/07/23 0818  Resp 13 08/07/23 0818  SpO2 98 % 08/07/23 0818  Vitals shown include unfiled device data.  Last Pain:  Vitals:   08/07/23 0703  PainSc: 0-No pain      Patients Stated Pain Goal: 0 (08/07/23 0703)  Complications: No notable events documented.

## 2023-08-11 ENCOUNTER — Encounter: Payer: Self-pay | Admitting: Ophthalmology

## 2023-08-22 ENCOUNTER — Ambulatory Visit: Payer: Medicare Other | Admitting: Urology

## 2023-09-11 ENCOUNTER — Ambulatory Visit: Payer: Medicare Other | Admitting: Urology
